# Patient Record
Sex: Female | Born: 1990 | Race: White | Hispanic: No | Marital: Single | Smoking: Former smoker
Health system: Southern US, Community
[De-identification: ages and names within clinical notes are randomized; demographics above are authoritative.]

## PROBLEM LIST (undated history)

## (undated) ENCOUNTER — Inpatient Hospital Stay (HOSPITAL_COMMUNITY): Payer: Self-pay

## (undated) DIAGNOSIS — N159 Renal tubulo-interstitial disease, unspecified: Secondary | ICD-10-CM

## (undated) DIAGNOSIS — IMO0002 Reserved for concepts with insufficient information to code with codable children: Secondary | ICD-10-CM

## (undated) DIAGNOSIS — F1920 Other psychoactive substance dependence, uncomplicated: Secondary | ICD-10-CM

## (undated) DIAGNOSIS — I739 Peripheral vascular disease, unspecified: Secondary | ICD-10-CM

## (undated) DIAGNOSIS — R87619 Unspecified abnormal cytological findings in specimens from cervix uteri: Secondary | ICD-10-CM

## (undated) HISTORY — DX: Unspecified abnormal cytological findings in specimens from cervix uteri: R87.619

## (undated) HISTORY — PX: APPENDECTOMY: SHX54

## (undated) HISTORY — DX: Other psychoactive substance dependence, uncomplicated: F19.20

## (undated) HISTORY — DX: Renal tubulo-interstitial disease, unspecified: N15.9

## (undated) HISTORY — DX: Reserved for concepts with insufficient information to code with codable children: IMO0002

---

## 2010-08-27 DIAGNOSIS — I739 Peripheral vascular disease, unspecified: Secondary | ICD-10-CM

## 2010-08-27 HISTORY — DX: Peripheral vascular disease, unspecified: I73.9

## 2010-08-27 HISTORY — PX: OTHER SURGICAL HISTORY: SHX169

## 2011-03-20 ENCOUNTER — Encounter: Payer: Self-pay | Admitting: Obstetrics & Gynecology

## 2013-03-25 ENCOUNTER — Ambulatory Visit: Payer: BC Managed Care – PPO | Admitting: *Deleted

## 2013-03-25 VITALS — BP 112/68 | Ht 62.0 in | Wt 135.0 lb

## 2013-03-25 DIAGNOSIS — Z3481 Encounter for supervision of other normal pregnancy, first trimester: Secondary | ICD-10-CM

## 2013-03-25 NOTE — Progress Notes (Signed)
p-71  Pt is here for her initial prenatal with the nurse.  She is G2P1 with a csection.  She does have a h/o pain med abuse.  Bedside u/s today showed CRL of 9w1 d and pos FHT.  PN labs were drawn today.  She will return 04/03/13 for a visit with the midwife.  She does want early genetic screening.

## 2013-03-26 LAB — OBSTETRIC PANEL
Antibody Screen: NEGATIVE
Basophils Absolute: 0 10*3/uL (ref 0.0–0.1)
Basophils Relative: 0 % (ref 0–1)
Eosinophils Absolute: 0.1 10*3/uL (ref 0.0–0.7)
Hemoglobin: 11.8 g/dL — ABNORMAL LOW (ref 12.0–15.0)
Hepatitis B Surface Ag: NEGATIVE
MCH: 29.4 pg (ref 26.0–34.0)
MCHC: 34.3 g/dL (ref 30.0–36.0)
Monocytes Absolute: 0.5 10*3/uL (ref 0.1–1.0)
Monocytes Relative: 10 % (ref 3–12)
Neutrophils Relative %: 62 % (ref 43–77)
RDW: 13.6 % (ref 11.5–15.5)
Rh Type: POSITIVE

## 2013-03-26 LAB — GC/CHLAMYDIA PROBE AMP, URINE
Chlamydia, Swab/Urine, PCR: NEGATIVE
GC Probe Amp, Urine: NEGATIVE

## 2013-04-03 ENCOUNTER — Ambulatory Visit (INDEPENDENT_AMBULATORY_CARE_PROVIDER_SITE_OTHER): Payer: BC Managed Care – PPO | Admitting: Family

## 2013-04-03 ENCOUNTER — Encounter (HOSPITAL_COMMUNITY): Payer: Self-pay | Admitting: Family

## 2013-04-03 ENCOUNTER — Encounter: Payer: Self-pay | Admitting: Family

## 2013-04-03 VITALS — BP 98/57 | Wt 135.0 lb

## 2013-04-03 DIAGNOSIS — Z98891 History of uterine scar from previous surgery: Secondary | ICD-10-CM

## 2013-04-03 DIAGNOSIS — F1910 Other psychoactive substance abuse, uncomplicated: Secondary | ICD-10-CM

## 2013-04-03 DIAGNOSIS — F17200 Nicotine dependence, unspecified, uncomplicated: Secondary | ICD-10-CM

## 2013-04-03 DIAGNOSIS — F1911 Other psychoactive substance abuse, in remission: Secondary | ICD-10-CM

## 2013-04-03 DIAGNOSIS — O09511 Supervision of elderly primigravida, first trimester: Secondary | ICD-10-CM

## 2013-04-03 DIAGNOSIS — O09519 Supervision of elderly primigravida, unspecified trimester: Secondary | ICD-10-CM

## 2013-04-03 DIAGNOSIS — O0990 Supervision of high risk pregnancy, unspecified, unspecified trimester: Secondary | ICD-10-CM

## 2013-04-03 DIAGNOSIS — O0991 Supervision of high risk pregnancy, unspecified, first trimester: Secondary | ICD-10-CM

## 2013-04-03 DIAGNOSIS — Z124 Encounter for screening for malignant neoplasm of cervix: Secondary | ICD-10-CM

## 2013-04-03 DIAGNOSIS — Z9889 Other specified postprocedural states: Secondary | ICD-10-CM

## 2013-04-03 NOTE — Progress Notes (Signed)
P - 84 Pt states she had some vaginal spotting yesterday

## 2013-04-03 NOTE — Progress Notes (Signed)
New provider OB visit; reviewed results of labs (all nml); Hx of csection > desires repeat.  Integrated screen scheduled.  Pap smear collected. Exam   BP 98/57  Wt 135 lb (61.236 kg)  BMI 24.69 kg/m2  LMP 01/14/2013 Uterine Size: size equals dates  Pelvic Exam:    Perineum: No Hemorrhoids, Normal Perineum   Vulva: normal   Vagina:  normal mucosa, normal discharge, no palpable nodules   pH: Not done   Cervix: no bleeding following Pap, no cervical motion tenderness and no lesions   Adnexa: normal adnexa and no mass, fullness, tenderness   Bony Pelvis: Adequate  System: Breast:  No nipple retraction or dimpling, No nipple discharge or bleeding, No axillary or supraclavicular adenopathy, Normal to palpation without dominant masses   Skin: normal coloration and turgor, no rashes    Neurologic: negative   Extremities: normal strength, tone, and muscle mass   HEENT neck supple with midline trachea and thyroid without masses   Mouth/Teeth mucous membranes moist, pharynx normal without lesions   Neck supple and no masses   Cardiovascular: regular rate and rhythm, no murmurs or gallops   Respiratory:  appears well, vitals normal, no respiratory distress, acyanotic, normal RR, neck free of mass or lymphadenopathy, chest clear, no wheezing, crepitations, rhonchi, normal symmetric air entry   Abdomen: soft, non-tender; bowel sounds normal; no masses,  no organomegaly   Urinary: urethral meatus normal

## 2013-04-20 ENCOUNTER — Encounter (HOSPITAL_COMMUNITY): Payer: Self-pay

## 2013-04-20 ENCOUNTER — Ambulatory Visit (HOSPITAL_COMMUNITY)
Admission: RE | Admit: 2013-04-20 | Discharge: 2013-04-20 | Disposition: A | Discharge status: Home / Self Care | Payer: BC Managed Care – PPO | Source: Ambulatory Visit | Attending: Obstetrics & Gynecology | Admitting: Obstetrics & Gynecology

## 2013-04-20 ENCOUNTER — Ambulatory Visit (HOSPITAL_COMMUNITY)
Admission: RE | Admit: 2013-04-20 | Discharge: 2013-04-20 | Disposition: A | Discharge status: Home / Self Care | Payer: BC Managed Care – PPO | Source: Ambulatory Visit | Attending: Family | Admitting: Family

## 2013-04-20 ENCOUNTER — Other Ambulatory Visit: Payer: Self-pay

## 2013-04-20 DIAGNOSIS — F1920 Other psychoactive substance dependence, uncomplicated: Secondary | ICD-10-CM | POA: Insufficient documentation

## 2013-04-20 DIAGNOSIS — O3510X0 Maternal care for (suspected) chromosomal abnormality in fetus, unspecified, not applicable or unspecified: Secondary | ICD-10-CM | POA: Insufficient documentation

## 2013-04-20 DIAGNOSIS — Z3689 Encounter for other specified antenatal screening: Secondary | ICD-10-CM | POA: Insufficient documentation

## 2013-04-20 DIAGNOSIS — O99330 Smoking (tobacco) complicating pregnancy, unspecified trimester: Secondary | ICD-10-CM | POA: Insufficient documentation

## 2013-04-20 DIAGNOSIS — F1120 Opioid dependence, uncomplicated: Secondary | ICD-10-CM | POA: Insufficient documentation

## 2013-04-20 DIAGNOSIS — O351XX0 Maternal care for (suspected) chromosomal abnormality in fetus, not applicable or unspecified: Secondary | ICD-10-CM | POA: Insufficient documentation

## 2013-04-20 DIAGNOSIS — O34219 Maternal care for unspecified type scar from previous cesarean delivery: Secondary | ICD-10-CM | POA: Insufficient documentation

## 2013-04-20 DIAGNOSIS — O09511 Supervision of elderly primigravida, first trimester: Secondary | ICD-10-CM

## 2013-04-20 NOTE — Progress Notes (Signed)
Sara Farrell  was seen today for an ultrasound appointment.  See full report in [facility].  Impression: Single IUP at 12 6/7 weeks Normal NT (2.2 mm) Nasal bone present First trimester aneuploidy screen performed as noted above.    Recommendations: Please do not draw triple/quad screen, though patient should be offered MSAFP for neural tube defect screening. Recommend follow up ultrasound at approximately 18 weeks for anatomy  Alpha Gula, MD

## 2013-04-22 ENCOUNTER — Telehealth: Payer: Self-pay | Admitting: *Deleted

## 2013-04-22 ENCOUNTER — Encounter: Payer: Self-pay | Admitting: Family

## 2013-04-22 ENCOUNTER — Encounter: Payer: Self-pay | Admitting: *Deleted

## 2013-04-22 DIAGNOSIS — Z98891 History of uterine scar from previous surgery: Secondary | ICD-10-CM

## 2013-04-22 DIAGNOSIS — F1911 Other psychoactive substance abuse, in remission: Secondary | ICD-10-CM

## 2013-04-22 DIAGNOSIS — O0991 Supervision of high risk pregnancy, unspecified, first trimester: Secondary | ICD-10-CM

## 2013-04-23 NOTE — Telephone Encounter (Signed)
See above

## 2013-05-04 ENCOUNTER — Ambulatory Visit (INDEPENDENT_AMBULATORY_CARE_PROVIDER_SITE_OTHER): Payer: BC Managed Care – PPO | Admitting: Family

## 2013-05-04 VITALS — BP 98/58 | Wt 129.0 lb

## 2013-05-04 DIAGNOSIS — O0990 Supervision of high risk pregnancy, unspecified, unspecified trimester: Secondary | ICD-10-CM

## 2013-05-04 DIAGNOSIS — O0991 Supervision of high risk pregnancy, unspecified, first trimester: Secondary | ICD-10-CM

## 2013-05-04 MED ORDER — ONDANSETRON 8 MG PO TBDP: 8.0000 mg | ORAL_TABLET | Freq: Three times a day (TID) | ORAL | Status: DC | PRN

## 2013-05-04 NOTE — Progress Notes (Signed)
P - 91 - Pt states still has n/v - dissolvable zofran helped more than the tablet zofran

## 2013-05-04 NOTE — Progress Notes (Signed)
Pt here on suboxone desiring to self detox; also would like nicotine replacement > refer to MFM for consultation regarding both nicotine replacement with concurrent suboxone use/possible detox.  Fundal height measuring 22 cm > growth ultrasound.

## 2013-05-06 ENCOUNTER — Encounter: Payer: Self-pay | Admitting: Obstetrics & Gynecology

## 2013-05-12 ENCOUNTER — Ambulatory Visit (HOSPITAL_COMMUNITY)
Admission: RE | Admit: 2013-05-12 | Discharge: 2013-05-12 | Disposition: A | Discharge status: Home / Self Care | Payer: BC Managed Care – PPO | Source: Ambulatory Visit | Attending: Family | Admitting: Family

## 2013-05-12 VITALS — BP 95/47 | HR 54 | Wt 131.0 lb

## 2013-05-12 DIAGNOSIS — F1120 Opioid dependence, uncomplicated: Secondary | ICD-10-CM

## 2013-05-12 DIAGNOSIS — O99332 Smoking (tobacco) complicating pregnancy, second trimester: Secondary | ICD-10-CM

## 2013-05-12 NOTE — Consult Note (Signed)
MFM consult  22 yr old G2P1001 at [redacted]w[redacted]d referred by Sid Falcon for consult secondary to narcotic dependence on suboxone and tobacco use.  Past OB hx: full term C section PMH: none PSH: C section M eds: suboxone, vitamins, zofran Allergies: none Social: smokes 5-10 cigarettes/day; is on suboxone due to use of pain pills prior is followed by a psychiatrist  I counseled the patient as follows: 1. Suboxone use: - in patients who have an addiction to narcotics or have chronic pain, suboxone maintenance therapy has been shown to decrease adverse events in pregnant women when compared to continuing non controlled doses of narcotics. A stable suboxone dose reduces stress on the fetus from repeated withdrawal due to inconsistent availability of illicit drugs. Additional benefits include a potential reduction in drug-seeking behaviors  - it is unclear if suboxone use is associated with teratogenicity; there have been some reports to suggest a slight increase in congenital anomalies with suboxone use  - there is an increased risk of preterm delivery, fetal growth restriction, and NICU admission with suboxone therapy in pregnancy  - there is a risk of neonatal abstinence syndrome. I explained the neonate will be closely monitored for signs/symptoms of withdrawal and will be treated as necessary. The neonates are often treated with opiates and slowly weaned- a process that can take days to weeks  - the recommendations are to continue a maintenance dose of suboxone in pregnancy as stopping suboxone in pregnancy has a higher risk of withdrawal, fetal death, and risk of use of illicit drugs  - suboxone should be continued after delivery. Additional pain medication should be given as indicated. Suboxone should not be used to treat pain related to delivery. Nubain should be avoided given it is a partial opiate antagonist.  - I recommend obtaining a Neonatology consult to discuss neonatal abstinence syndrome  with the patient 2. I recommend serial ultrasounds for fetal growth every 4 weeks. Antenatal testing should be started only in the setting of fetal growth restriction.  3. I recommend a fetal anatomic survey at 18-20 weeks.  4. Patient had a nromal first trimester screen - recommend maternal serum AFP at 15-[redacted] weeks gestation 5. Tobacco use: - discussed risks of tobacco use in pregnancy: fetal growth restriction, placental abruption, preterm delivery, and after delivery risk of SIDS and childhood asthma - patient is interested in stopping; has failed trying on her own - discussed option of nicotine patch- discussed may be used in pregnancy; need to avoid smoking while on the nicotine patch - discussed there are adverse effects of nicotine on the fetus; however no strong evidence that use of nicotine replacement therapy increases risk of adverse outcomes compared to smokers - also discussed option of buproprion: discussed may be linked to increase risk of birth defects if taken in the first trimester; however after the first trimester no known associated risks although may have a component of neonatal withdrawal - discussed there is little data on chantix in pregnancy so other modalities are preferred  I spent 30 minutes in face to face consultation with the patient.  Eulis Foster, MD

## 2013-05-13 ENCOUNTER — Encounter: Payer: Self-pay | Admitting: Family

## 2013-05-18 ENCOUNTER — Encounter: Payer: BC Managed Care – PPO | Admitting: Obstetrics and Gynecology

## 2013-05-25 ENCOUNTER — Other Ambulatory Visit: Payer: Self-pay | Admitting: Family

## 2013-05-25 DIAGNOSIS — O0991 Supervision of high risk pregnancy, unspecified, first trimester: Secondary | ICD-10-CM

## 2013-05-26 ENCOUNTER — Ambulatory Visit (HOSPITAL_COMMUNITY)
Admission: RE | Admit: 2013-05-26 | Discharge: 2013-05-26 | Disposition: A | Discharge status: Home / Self Care | Payer: BC Managed Care – PPO | Source: Ambulatory Visit | Attending: Obstetrics and Gynecology | Admitting: Obstetrics and Gynecology

## 2013-05-26 VITALS — BP 101/50 | HR 66 | Wt 133.0 lb

## 2013-05-26 DIAGNOSIS — F1911 Other psychoactive substance abuse, in remission: Secondary | ICD-10-CM

## 2013-05-26 DIAGNOSIS — O0991 Supervision of high risk pregnancy, unspecified, first trimester: Secondary | ICD-10-CM

## 2013-05-26 DIAGNOSIS — Z98891 History of uterine scar from previous surgery: Secondary | ICD-10-CM

## 2013-05-26 DIAGNOSIS — Z363 Encounter for antenatal screening for malformations: Secondary | ICD-10-CM | POA: Insufficient documentation

## 2013-05-26 DIAGNOSIS — F1120 Opioid dependence, uncomplicated: Secondary | ICD-10-CM | POA: Insufficient documentation

## 2013-05-26 DIAGNOSIS — F1920 Other psychoactive substance dependence, uncomplicated: Secondary | ICD-10-CM | POA: Insufficient documentation

## 2013-05-26 DIAGNOSIS — Z1389 Encounter for screening for other disorder: Secondary | ICD-10-CM | POA: Insufficient documentation

## 2013-05-26 DIAGNOSIS — O358XX0 Maternal care for other (suspected) fetal abnormality and damage, not applicable or unspecified: Secondary | ICD-10-CM | POA: Insufficient documentation

## 2013-05-26 NOTE — ED Notes (Signed)
Patient states she has not felt movement in over 2 weeks.

## 2013-05-27 ENCOUNTER — Encounter: Payer: Self-pay | Admitting: Family

## 2013-05-31 ENCOUNTER — Encounter (HOSPITAL_COMMUNITY): Payer: Self-pay | Admitting: *Deleted

## 2013-05-31 ENCOUNTER — Inpatient Hospital Stay (HOSPITAL_COMMUNITY)
Admission: AD | Admit: 2013-05-31 | Discharge: 2013-06-01 | Disposition: A | Discharge status: Home / Self Care | Payer: BC Managed Care – PPO | Source: Ambulatory Visit | Attending: Obstetrics & Gynecology | Admitting: Obstetrics & Gynecology

## 2013-05-31 DIAGNOSIS — Y92410 Unspecified street and highway as the place of occurrence of the external cause: Secondary | ICD-10-CM | POA: Insufficient documentation

## 2013-05-31 DIAGNOSIS — O9A212 Injury, poisoning and certain other consequences of external causes complicating pregnancy, second trimester: Secondary | ICD-10-CM

## 2013-05-31 DIAGNOSIS — M7918 Myalgia, other site: Secondary | ICD-10-CM

## 2013-05-31 DIAGNOSIS — IMO0001 Reserved for inherently not codable concepts without codable children: Secondary | ICD-10-CM | POA: Insufficient documentation

## 2013-05-31 DIAGNOSIS — M549 Dorsalgia, unspecified: Secondary | ICD-10-CM | POA: Insufficient documentation

## 2013-05-31 DIAGNOSIS — O99891 Other specified diseases and conditions complicating pregnancy: Secondary | ICD-10-CM | POA: Insufficient documentation

## 2013-05-31 DIAGNOSIS — R109 Unspecified abdominal pain: Secondary | ICD-10-CM | POA: Insufficient documentation

## 2013-05-31 MED ORDER — CYCLOBENZAPRINE HCL 10 MG PO TABS
10.0000 mg | ORAL_TABLET | ORAL | Status: AC
  Administered 2013-06-01: 10 mg via ORAL
  Filled 2013-05-31: qty 1

## 2013-05-31 NOTE — MAU Note (Signed)
Pt arrived per EMS. Pt rates 8/10 R lower back pain and L side pain. Pt reports that it hurts worse when she breathes in.
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
## 2013-06-01 DIAGNOSIS — IMO0001 Reserved for inherently not codable concepts without codable children: Secondary | ICD-10-CM

## 2013-06-01 DIAGNOSIS — M549 Dorsalgia, unspecified: Secondary | ICD-10-CM

## 2013-06-01 DIAGNOSIS — R109 Unspecified abdominal pain: Secondary | ICD-10-CM

## 2013-06-01 MED ORDER — OXYCODONE-ACETAMINOPHEN 5-325 MG PO TABS: 2.0000 | ORAL_TABLET | Freq: Four times a day (QID) | ORAL | Status: DC | PRN

## 2013-06-01 MED ORDER — OXYCODONE-ACETAMINOPHEN 5-325 MG PO TABS
2.0000 | ORAL_TABLET | ORAL | Status: AC
  Administered 2013-06-01: 2 via ORAL
  Filled 2013-06-01: qty 2

## 2013-06-01 MED ORDER — IBUPROFEN 600 MG PO TABS: 600.0000 mg | ORAL_TABLET | Freq: Four times a day (QID) | ORAL | Status: DC | PRN

## 2013-06-01 NOTE — MAU Provider Note (Signed)
Attestation of Attending Supervision of Advanced Practitioner (CNM/NP): Evaluation and management procedures were performed by the Advanced Practitioner under my supervision and collaboration. I have reviewed the Advanced Practitioner's note and chart, and I agree with the management and plan.  LEGGETT,KELLY H. 7:08 AM

## 2013-06-05 ENCOUNTER — Encounter: Payer: BC Managed Care – PPO | Admitting: Family

## 2013-06-08 ENCOUNTER — Encounter: Payer: BC Managed Care – PPO | Admitting: Advanced Practice Midwife

## 2013-07-20 ENCOUNTER — Other Ambulatory Visit: Payer: Self-pay | Admitting: Family

## 2013-07-20 DIAGNOSIS — O0991 Supervision of high risk pregnancy, unspecified, first trimester: Secondary | ICD-10-CM

## 2013-07-21 ENCOUNTER — Ambulatory Visit (HOSPITAL_COMMUNITY): Payer: No Typology Code available for payment source | Attending: Obstetrics and Gynecology

## 2013-08-04 ENCOUNTER — Ambulatory Visit (INDEPENDENT_AMBULATORY_CARE_PROVIDER_SITE_OTHER): Payer: BC Managed Care – PPO | Admitting: Obstetrics & Gynecology

## 2013-08-04 VITALS — BP 113/59 | Wt 146.0 lb

## 2013-08-04 DIAGNOSIS — F17200 Nicotine dependence, unspecified, uncomplicated: Secondary | ICD-10-CM

## 2013-08-04 DIAGNOSIS — O0993 Supervision of high risk pregnancy, unspecified, third trimester: Secondary | ICD-10-CM

## 2013-08-04 DIAGNOSIS — O0990 Supervision of high risk pregnancy, unspecified, unspecified trimester: Secondary | ICD-10-CM

## 2013-08-04 DIAGNOSIS — Z86718 Personal history of other venous thrombosis and embolism: Secondary | ICD-10-CM | POA: Insufficient documentation

## 2013-08-04 NOTE — Progress Notes (Deleted)
Pt returns to prenatal care after 2 months.  Need [place] for growth per MFM.  Also pt has ? History of blood clot in her right arm.

## 2013-08-04 NOTE — Progress Notes (Signed)
P - 101 - Pt states she had experienced a lot of "back cramps and lower abd cramps along with bilateral hip pain" - Pt has never had anxiety attacks but recently has had them for about 1.5 months ago.

## 2013-08-04 NOTE — Progress Notes (Signed)
Pt returns to prenatal care after 2 months. Need [place] for growth per MFM. Also pt has ? History of blood clot in her right arm 2 1/2 yrs ago.  Pt was on blood thinners (? Coumadin).  Need records.  If s, pt needs anticoagulation.  Pt down to 1/8 tab Subuxone daily without symptoms of withdrawal.   Pt advised not to ween during pregnancy due to risk of preterm labor.  Pt will contact us with signs of withdrawal.   Needs GCT adn 28 week labs.  Need [place] for growth per MFM

## 2013-08-04 NOTE — Progress Notes (Deleted)
Pt has history of blood clot in right arm 2 1/2 years ago.

## 2013-08-06 ENCOUNTER — Other Ambulatory Visit: Payer: BC Managed Care – PPO

## 2013-08-11 ENCOUNTER — Ambulatory Visit (HOSPITAL_COMMUNITY)
Admission: RE | Admit: 2013-08-11 | Discharge: 2013-08-11 | Disposition: A | Discharge status: Home / Self Care | Payer: BC Managed Care – PPO | Source: Ambulatory Visit | Attending: Obstetrics and Gynecology | Admitting: Obstetrics and Gynecology

## 2013-08-11 ENCOUNTER — Encounter: Payer: Self-pay | Admitting: Obstetrics & Gynecology

## 2013-08-11 VITALS — BP 120/64 | HR 68 | Wt 146.0 lb

## 2013-08-11 DIAGNOSIS — O99330 Smoking (tobacco) complicating pregnancy, unspecified trimester: Secondary | ICD-10-CM | POA: Insufficient documentation

## 2013-08-11 DIAGNOSIS — F1920 Other psychoactive substance dependence, uncomplicated: Secondary | ICD-10-CM | POA: Insufficient documentation

## 2013-08-11 DIAGNOSIS — O34219 Maternal care for unspecified type scar from previous cesarean delivery: Secondary | ICD-10-CM | POA: Insufficient documentation

## 2013-08-11 DIAGNOSIS — F1120 Opioid dependence, uncomplicated: Secondary | ICD-10-CM | POA: Insufficient documentation

## 2013-08-11 DIAGNOSIS — O0991 Supervision of high risk pregnancy, unspecified, first trimester: Secondary | ICD-10-CM

## 2013-08-11 DIAGNOSIS — Z98891 History of uterine scar from previous surgery: Secondary | ICD-10-CM

## 2013-08-11 DIAGNOSIS — F1911 Other psychoactive substance abuse, in remission: Secondary | ICD-10-CM

## 2013-08-11 DIAGNOSIS — O0993 Supervision of high risk pregnancy, unspecified, third trimester: Secondary | ICD-10-CM

## 2013-08-11 NOTE — Progress Notes (Signed)
Sara Farrell  was seen today for an ultrasound appointment.  See full report in [facility].  Impression: Single IUP at 29 0/7 weeks Hx of Suboxone use, Tobacco use Interval growth is appropriate (48th %tile) Normal amniotic fluid volume  Recommendations: Follow-up ultrasounds as clinically indicated.   Alpha Gula, MD

## 2013-08-18 ENCOUNTER — Encounter: Payer: BC Managed Care – PPO | Admitting: Family Medicine

## 2013-10-08 ENCOUNTER — Ambulatory Visit (INDEPENDENT_AMBULATORY_CARE_PROVIDER_SITE_OTHER): Payer: BC Managed Care – PPO | Admitting: Obstetrics & Gynecology

## 2013-10-08 ENCOUNTER — Encounter: Payer: Self-pay | Admitting: Obstetrics & Gynecology

## 2013-10-08 VITALS — BP 111/65 | Wt 149.0 lb

## 2013-10-08 DIAGNOSIS — Z3480 Encounter for supervision of other normal pregnancy, unspecified trimester: Secondary | ICD-10-CM

## 2013-10-08 NOTE — Progress Notes (Signed)
p-80  Pt has not had her 28 week labs.  She states that her insurance was cancelled in January and that is why she hasn't been here.  Wants to discuss having scheduled c-section

## 2013-10-08 NOTE — Progress Notes (Signed)
Pt has not been to appts for 10 weeks.  Never got [facility] about blood clot in the arm.  Will re request records. Cultures and UDS today.  Last growth at MFM nml. C/S and BTL scheduled for 39 weeks (Feb 24th).

## 2013-10-09 LAB — GC/CHLAMYDIA PROBE AMP
CT Probe RNA: NEGATIVE
GC Probe RNA: NEGATIVE

## 2013-10-10 LAB — CULTURE, BETA STREP (GROUP B ONLY)

## 2013-10-12 LAB — OPIATES/OPIOIDS (LC/MS-MS)
CODEINE URINE: NEGATIVE ng/mL
HYDROCODONE: NEGATIVE ng/mL
HYDROMORPHONE: NEGATIVE ng/mL
Heroin (6-AM), UR: NEGATIVE ng/mL
MORPHINE: NEGATIVE ng/mL
NORHYDROCODONE, UR: NEGATIVE ng/mL
Noroxycodone, Ur: NEGATIVE ng/mL
Oxycodone, ur: NEGATIVE ng/mL
Oxymorphone: NEGATIVE ng/mL

## 2013-10-12 LAB — PRESCRIPTION MONITORING PROFILE (19 PANEL)
AMPHETAMINE/METH: NEGATIVE ng/mL
BARBITURATE SCREEN, URINE: NEGATIVE ng/mL
BENZODIAZEPINE SCREEN, URINE: NEGATIVE ng/mL
COCAINE METABOLITES: NEGATIVE ng/mL
CREATININE, URINE: 103.86 mg/dL (ref >20.0)
Carisoprodol, Urine: NEGATIVE ng/mL
FENTANYL URINE: NEGATIVE ng/mL
MDMA URINE: NEGATIVE ng/mL
Meperidine, Ur: NEGATIVE ng/mL
Methadone Screen, Urine: NEGATIVE ng/mL
Methaqualone: NEGATIVE ng/mL
Nitrites, Initial: NEGATIVE ug/mL
OXYCODONE SCRN UR: NEGATIVE ng/mL
PH URINE, INITIAL: 7.1 pH (ref 4.5–8.9)
Phencyclidine, Ur: NEGATIVE ng/mL
Propoxyphene: NEGATIVE ng/mL
TAPENTADOLUR: NEGATIVE ng/mL
TRAMADOL UR: NEGATIVE ng/mL
ZOLPIDEM, URINE: NEGATIVE ng/mL

## 2013-10-12 LAB — BUPRENORPHINES (GC/LC/MS), URINE
Buprenorphine: >1000 ng/mL — AB
NORBUPRENORPHINE: 885 ng/mL — AB

## 2013-10-12 LAB — CANNABANOIDS (GC/LC/MS), URINE: THC-COOH UR CONFIRM: 298 ng/mL — AB

## 2013-10-13 ENCOUNTER — Other Ambulatory Visit: Payer: Self-pay | Admitting: Obstetrics & Gynecology

## 2013-10-13 ENCOUNTER — Encounter: Payer: Self-pay | Admitting: Obstetrics & Gynecology

## 2013-10-14 ENCOUNTER — Encounter (HOSPITAL_COMMUNITY): Payer: Self-pay | Admitting: Pharmacist

## 2013-10-16 ENCOUNTER — Encounter: Payer: Self-pay | Admitting: Advanced Practice Midwife

## 2013-10-16 ENCOUNTER — Ambulatory Visit (INDEPENDENT_AMBULATORY_CARE_PROVIDER_SITE_OTHER): Payer: BC Managed Care – PPO | Admitting: Advanced Practice Midwife

## 2013-10-16 VITALS — BP 82/59 | Wt 155.0 lb

## 2013-10-16 DIAGNOSIS — Z3480 Encounter for supervision of other normal pregnancy, unspecified trimester: Secondary | ICD-10-CM

## 2013-10-16 DIAGNOSIS — O0930 Supervision of pregnancy with insufficient antenatal care, unspecified trimester: Secondary | ICD-10-CM

## 2013-10-16 NOTE — Progress Notes (Signed)
p-72 attempt to drink 50 gm of Glucola and stopped due to N and V

## 2013-10-16 NOTE — H&P (Signed)
  Sara Farrell is an 23 y.o. G2P1001 2451w3d female.   Chief Complaint: IUP at 39 wks, previous C-S, desires repeat HPI: Previous C-section for fetal distress, elects repeat with sterilization.  Past Medical History  Diagnosis Date  . Addiction to drug     Percocet  . Abnormal Pap smear     HPV  . Chronic kidney infection     Increased freq of kidney infx    Past Surgical History  Procedure Laterality Date  . Appendectomy      age 23  . Cesarean section      Family History  Problem Relation Age of Onset  . Cancer Maternal Grandmother     renal  . Emphysema Maternal Grandfather   . Prune belly syndrome Maternal Uncle     Renal problems   Social History:  reports that she has been smoking Cigarettes.  She has a 2 pack-year smoking history. She has never used smokeless tobacco. She reports that she uses illicit drugs. She reports that she does not drink alcohol.  Allergies: No Known Allergies  No prescriptions prior to admission     A comprehensive review of systems was negative.  Last menstrual period 01/14/2013. LMP 01/14/2013 General appearance: alert, cooperative and appears stated age Head: Normocephalic, without obvious abnormality, atraumatic Neck: supple, symmetrical, trachea midline Lungs: clear to auscultation bilaterally Heart: regular rate and rhythm Abdomen: normal findings: soft, non-tender and gravid Extremities: extremities normal, atraumatic, no cyanosis or edema Skin: Skin color, texture, turgor normal. No rashes or lesions Neurologic: Grossly normal   Prenatal Transfer Tool  Maternal Diabetes: No Unknown--did not take 1 hour Genetic Screening: Normal first screen Maternal Ultrasounds/Referrals: Normal Fetal Ultrasounds or other Referrals:  None Maternal Substance Abuse:  Yes:  Type: Marijuana, Other: suboxone Significant Maternal Medications:  None see above Significant Maternal Lab Results: Lab values include: Group B Strep  positive     Lab Results  Component Value Date   WBC 5.1 03/25/2013   HGB 11.8* 03/25/2013   HCT 34.4* 03/25/2013   MCV 85.8 03/25/2013   PLT 174 03/25/2013   No results found for this basename: PREGTESTUR, PREGSERUM, HCG, HCGQUANT     Assessment/Plan Patient Active Problem List   Diagnosis Date Noted  . Possible history of blood clot in right arm. 08/04/2013  . Supervision of high risk pregnancy in first trimester 04/03/2013  . Hx of substance abuse 04/03/2013  . Active smoker 04/03/2013  . H/O: C-section 04/03/2013  Desires sterility Repeat C-section and BTL  Sara Farrell S 10/16/2013, 12:46 PM

## 2013-10-18 DIAGNOSIS — O0930 Supervision of pregnancy with insufficient antenatal care, unspecified trimester: Secondary | ICD-10-CM | POA: Insufficient documentation

## 2013-10-18 NOTE — Progress Notes (Signed)
Reviewed signs of labor. Unable to complete glucola, vomited fluid. Unable to do random FS due to drinking glucola. Has C/S scheduled in 4 days.  UDS + for MJ and opiates

## 2013-10-18 NOTE — Patient Instructions (Signed)
Cesarean Delivery  Cesarean delivery is the birth of a baby through a cut (incision) in the abdomen and womb (uterus).  LET YOUR HEALTH CARE PROVIDER KNOW ABOUT:  All medicines you are taking, including vitamins, herbs, eye drops, creams, and over-the-counter medicines.  Previous problems you or members of your family have had with the use of anesthetics.  Any blood disorders you have.  Previous surgeries you have had.  Medical conditions you have.  Any allergies you have.  Complicationsinvolving the pregnancy. RISKS AND COMPLICATIONS  Generally, this is a safe procedure. However, as with any procedure, complications can occur. Possible complications include:  Bleeding.  Infection.  Blood clots.  Injury to surrounding organs.  Problems with anesthesia.  Injury to the baby. BEFORE THE PROCEDURE   You may be given an antacid medicine to drink. This will prevent acid contents in your stomach from going into your lungs if you vomit during the surgery.  You may be given an antibiotic medicine to prevent infection. PROCEDURE   Hair may be removed from your pubic area and your lower abdomen. This is to prevent infection in the incision site.  A tube (Foley catheter) will be placed in your bladder to drain your urine from your bladder into a bag. This keeps your bladder empty during surgery.  An IV tube will be placed in your vein.  You may be given medicine to numb the lower half of your body (regional anesthetic). If you were in labor, you may have already had an epidural in place which can be used in both labor and cesarean delivery. You may possibly be given medicine to make you sleep (general anesthetic) though this is not as common.  An incision will be made in your abdomen that extends to your uterus. There are 2 basic kinds of incisions:  The horizontal (transverse) incision. Horizontal incisions are from side to side and are used for most routine cesarean  deliveries.  The vertical incision. The vertical incision is from the top of the abdomen to the bottom and is less commonly used. It is often done for women who have a serious complication (extreme prematurity) or under emergency situations.  The horizontal and vertical incisions may both be used at the same time. However, this is very uncommon.  An incision is then made in your uterus to deliver the baby.  Your baby will then be delivered.  Both incisions are then closed with absorbable stitches. AFTER THE PROCEDURE   If you were awake during the surgery, you will see your baby right away. If you were asleep, you will see your baby as soon as you are awake.  You may breastfeed your baby after surgery.  You may be able to get up and walk the same day as the surgery. If you need to stay in bed for a period of time, you will receive help to turn, cough, and take deep breaths after surgery. This helps prevent lung problems such as pneumonia.  Do not get out of bed alone the first time after surgery. You will need help getting out of bed until you are able to do this by yourself.  You may be able to shower the day after your cesarean delivery. After the bandage (dressing) is taken off the incision site, a nurse will assist you to shower if you would like help.  You will have pneumatic compression hose placed on your lower legs. This is done to prevent blood clots. When you are up   and walking regularly, they will no longer be necessary.  Do not cross your legs when you sit.  Save any blood clots that you pass. If you pass a clot while on the toilet, do not flush it. Call for the nurse. Tell the nurse if you think you are bleeding too much or passing too many clots.  You will be given medicine as needed. Let your health care providers know if you are hurting. You may also be given an antibiotic to prevent an infection.  Your IV tube will be taken out when you are drinking a reasonable  amount of fluids. The Foley catheter is taken out when you are up and walking.  If your blood type is Rh negative and your baby's blood type is Rh positive, you will be given a shot of anti-D immune globulin. This shot prevents you from having Rh problems with a future pregnancy. You should get the shot even if you had your tubes tied (tubal ligation).  If you are allowed to take the baby for a walk, place the baby in the bassinet and push it. Do not carry your baby in your arms. Document Released: 08/13/2005 Document Revised: 06/03/2013 Document Reviewed: 03/04/2013 Exit[employer] Patient Information 2014 [employer].  

## 2013-10-19 ENCOUNTER — Encounter (HOSPITAL_COMMUNITY)
Admission: RE | Admit: 2013-10-19 | Discharge: 2013-10-19 | Disposition: A | Discharge status: Home / Self Care | Payer: BC Managed Care – PPO | Source: Ambulatory Visit | Attending: Family Medicine | Admitting: Family Medicine

## 2013-10-19 ENCOUNTER — Inpatient Hospital Stay (HOSPITAL_COMMUNITY)
Admission: RE | Admit: 2013-10-19 | Discharge: 2013-10-22 | DRG: 766 | Disposition: A | Discharge status: Home / Self Care | Payer: BC Managed Care – PPO | Source: Ambulatory Visit | Attending: Family Medicine | Admitting: Family Medicine

## 2013-10-19 ENCOUNTER — Encounter (HOSPITAL_COMMUNITY): Payer: Self-pay

## 2013-10-19 ENCOUNTER — Encounter (HOSPITAL_COMMUNITY): Payer: Self-pay | Admitting: *Deleted

## 2013-10-19 ENCOUNTER — Ambulatory Visit (HOSPITAL_COMMUNITY)
Admission: RE | Admit: 2013-10-19 | Discharge: 2013-10-19 | Disposition: A | Discharge status: Home / Self Care | Payer: BC Managed Care – PPO | Source: Ambulatory Visit | Attending: Anesthesiology | Admitting: Anesthesiology

## 2013-10-19 DIAGNOSIS — O0991 Supervision of high risk pregnancy, unspecified, first trimester: Secondary | ICD-10-CM

## 2013-10-19 DIAGNOSIS — F1911 Other psychoactive substance abuse, in remission: Secondary | ICD-10-CM

## 2013-10-19 DIAGNOSIS — O99892 Other specified diseases and conditions complicating childbirth: Secondary | ICD-10-CM | POA: Diagnosis present

## 2013-10-19 DIAGNOSIS — Z98891 History of uterine scar from previous surgery: Secondary | ICD-10-CM

## 2013-10-19 DIAGNOSIS — O0930 Supervision of pregnancy with insufficient antenatal care, unspecified trimester: Secondary | ICD-10-CM

## 2013-10-19 DIAGNOSIS — Z9889 Other specified postprocedural states: Secondary | ICD-10-CM

## 2013-10-19 DIAGNOSIS — O34219 Maternal care for unspecified type scar from previous cesarean delivery: Principal | ICD-10-CM | POA: Diagnosis present

## 2013-10-19 DIAGNOSIS — Z302 Encounter for sterilization: Secondary | ICD-10-CM

## 2013-10-19 DIAGNOSIS — Z22330 Carrier of Group B streptococcus: Secondary | ICD-10-CM

## 2013-10-19 DIAGNOSIS — O9989 Other specified diseases and conditions complicating pregnancy, childbirth and the puerperium: Secondary | ICD-10-CM

## 2013-10-19 DIAGNOSIS — O99334 Smoking (tobacco) complicating childbirth: Secondary | ICD-10-CM | POA: Diagnosis present

## 2013-10-19 HISTORY — DX: Peripheral vascular disease, unspecified: I73.9

## 2013-10-19 MED ORDER — HEPARIN SOD (PORK) LOCK FLUSH 100 UNIT/ML IV SOLN
INTRAVENOUS | Status: AC
  Filled 2013-10-19: qty 5

## 2013-10-19 MED ORDER — LIDOCAINE HCL 1 % IJ SOLN
INTRAMUSCULAR | Status: AC
  Filled 2013-10-19: qty 20

## 2013-10-19 NOTE — Pre-Procedure Instructions (Signed)
Dr Cristela BlueKyle Jackson assess patient for IV.  Verbal order for PICC line insertion for surgery.  Verified verbal order.  Order placed in EPIC and [facility]. [facility] today after PAT appt.

## 2013-10-19 NOTE — Pre-Procedure Instructions (Signed)
[facility] if [facility] from PICC line around 12:15-12:30PM.  Sara Farrell stated to she or a nurse from [facility] to surgery.  Short Stay RN to call Sara Farrell at ext [phone].

## 2013-10-19 NOTE — Patient Instructions (Signed)
   Your procedure is scheduled on: Tuesday, Feb 24  [facility] at:  1215 PM - arrive earlier for labs to be drawn out of PICC Pick up the phone at the desk and dial [phone] and inform us of your arrival.  Please call this number if you have any problems the morning of surgery: [phone]  Remember: Do not eat after midnight: Monday Do not drink clear liquads after 10 AM Tuesday, day of surgery Take these medicines the morning of surgery with a SIP OF WATER:  None   Do not wear jewelry, make-up, or FINGER nail polish No metal in your hair or on your body. Do not wear lotions, powders, perfumes.You may wear deodorant.  Do not bring valuables to the hospital. Contacts, dentures or bridgework may not be worn into surgery.  Leave suitcase in the car. After Surgery it may be brought to your room. For patients being admitted to the hospital, checkout time is 11:00am the day of discharge.  Home with fiance Sara Farrell/ mother Sara Farrell cell [phone].

## 2013-10-19 NOTE — Procedures (Signed)
Placement of right arm PICC.  Tip in SVC and ready to use. Length = 34 cm.

## 2013-10-19 NOTE — Patient Instructions (Addendum)
   Your procedure is scheduled on:  Tuesday, Feb 24  [facility] at: 115 PM Pick up the phone at the desk and dial [phone] and inform us of your arrival.  Please call this number if you have any problems the morning of surgery: [phone]  Remember: Do not eat food after midnight: Monday Do not drink clear liquids after: 10 AM Tuesday, day of surgery Take these medicines the morning of surgery with a SIP OF WATER:  None  Do not wear jewelry, make-up, or FINGER nail polish No metal in your hair or on your body. Do not wear lotions, powders, perfumes.  You may wear deodorant.  Do not bring valuables to the hospital. Contacts, dentures or bridgework may not be worn into surgery.  Leave suitcase in the car. After Surgery it may be brought to your room. For patients being admitted to the hospital, checkout time is 11:00am the day of discharge.  Home with fiance [name]/ mother Suzette BattiestVeronica cell [phone].

## 2013-10-20 ENCOUNTER — Encounter (HOSPITAL_COMMUNITY): Payer: Self-pay | Admitting: *Deleted

## 2013-10-20 ENCOUNTER — Inpatient Hospital Stay (HOSPITAL_COMMUNITY): Payer: BC Managed Care – PPO | Admitting: Anesthesiology

## 2013-10-20 ENCOUNTER — Encounter (HOSPITAL_COMMUNITY)
Admission: RE | Disposition: A | Discharge status: Home / Self Care | Payer: Self-pay | Source: Ambulatory Visit | Attending: Family Medicine

## 2013-10-20 ENCOUNTER — Encounter (HOSPITAL_COMMUNITY): Payer: BC Managed Care – PPO | Admitting: Anesthesiology

## 2013-10-20 DIAGNOSIS — Z302 Encounter for sterilization: Secondary | ICD-10-CM

## 2013-10-20 DIAGNOSIS — O99892 Other specified diseases and conditions complicating childbirth: Secondary | ICD-10-CM

## 2013-10-20 DIAGNOSIS — O99334 Smoking (tobacco) complicating childbirth: Secondary | ICD-10-CM

## 2013-10-20 DIAGNOSIS — O9989 Other specified diseases and conditions complicating pregnancy, childbirth and the puerperium: Secondary | ICD-10-CM

## 2013-10-20 DIAGNOSIS — O34219 Maternal care for unspecified type scar from previous cesarean delivery: Secondary | ICD-10-CM

## 2013-10-20 DIAGNOSIS — Z9889 Other specified postprocedural states: Secondary | ICD-10-CM

## 2013-10-20 LAB — CBC
HEMATOCRIT: 35 % — AB (ref 36.0–46.0)
Hemoglobin: 11.8 g/dL — ABNORMAL LOW (ref 12.0–15.0)
MCH: 29.6 pg (ref 26.0–34.0)
MCHC: 33.7 g/dL (ref 30.0–36.0)
MCV: 87.9 fL (ref 78.0–100.0)
Platelets: 144 10*3/uL — ABNORMAL LOW (ref 150–400)
RBC: 3.98 MIL/uL (ref 3.87–5.11)
RDW: 12.7 % (ref 11.5–15.5)
WBC: 11.1 10*3/uL — AB (ref 4.0–10.5)

## 2013-10-20 LAB — TYPE AND SCREEN
ABO/RH(D): O POS
Antibody Screen: NEGATIVE

## 2013-10-20 LAB — RAPID URINE DRUG SCREEN, HOSP PERFORMED
AMPHETAMINES: NOT DETECTED
BARBITURATES: NOT DETECTED
Benzodiazepines: NOT DETECTED
Cocaine: NOT DETECTED
Opiates: POSITIVE — AB
Tetrahydrocannabinol: POSITIVE — AB

## 2013-10-20 LAB — ABO/RH: ABO/RH(D): O POS

## 2013-10-20 LAB — RPR: RPR: NONREACTIVE

## 2013-10-20 SURGERY — Surgical Case
Anesthesia: Spinal | Site: Abdomen | Laterality: Bilateral

## 2013-10-20 MED ORDER — LACTATED RINGERS IV BOLUS (SEPSIS)
500.0000 mL | Freq: Once | INTRAVENOUS | Status: AC
  Administered 2013-10-21: 500 mL via INTRAVENOUS

## 2013-10-20 MED ORDER — NALBUPHINE HCL 10 MG/ML IJ SOLN: 5.0000 mg | INTRAMUSCULAR | Status: DC | PRN

## 2013-10-20 MED ORDER — CEFAZOLIN SODIUM-DEXTROSE 2-3 GM-% IV SOLR
2.0000 g | INTRAVENOUS | Status: AC
  Administered 2013-10-20: 2 g via INTRAVENOUS

## 2013-10-20 MED ORDER — BUPIVACAINE HCL (PF) 0.25 % IJ SOLN
INTRAMUSCULAR | Status: AC
  Filled 2013-10-20: qty 30

## 2013-10-20 MED ORDER — FENTANYL CITRATE 0.05 MG/ML IJ SOLN
INTRAMUSCULAR | Status: AC
  Filled 2013-10-20: qty 2

## 2013-10-20 MED ORDER — ONDANSETRON HCL 4 MG/2ML IJ SOLN
INTRAMUSCULAR | Status: AC
  Filled 2013-10-20: qty 2

## 2013-10-20 MED ORDER — SODIUM CHLORIDE 0.9 % IJ SOLN: 3.0000 mL | INTRAMUSCULAR | Status: DC | PRN

## 2013-10-20 MED ORDER — DIPHENHYDRAMINE HCL 25 MG PO CAPS: 25.0000 mg | ORAL_CAPSULE | Freq: Four times a day (QID) | ORAL | Status: DC | PRN

## 2013-10-20 MED ORDER — SIMETHICONE 80 MG PO CHEW
80.0000 mg | CHEWABLE_TABLET | ORAL | Status: DC
  Administered 2013-10-21: 80 mg via ORAL
  Filled 2013-10-20 (×2): qty 1

## 2013-10-20 MED ORDER — METOCLOPRAMIDE HCL 5 MG/ML IJ SOLN: 10.0000 mg | Freq: Three times a day (TID) | INTRAMUSCULAR | Status: DC | PRN

## 2013-10-20 MED ORDER — HYDROMORPHONE HCL PF 1 MG/ML IJ SOLN
INTRAMUSCULAR | Status: AC
  Administered 2013-10-20: 0.5 mg via INTRAVENOUS
  Filled 2013-10-20: qty 1

## 2013-10-20 MED ORDER — DIPHENHYDRAMINE HCL 25 MG PO CAPS: 25.0000 mg | ORAL_CAPSULE | ORAL | Status: DC | PRN

## 2013-10-20 MED ORDER — DIBUCAINE 1 % RE OINT: 1.0000 "application " | TOPICAL_OINTMENT | RECTAL | Status: DC | PRN

## 2013-10-20 MED ORDER — MEPERIDINE HCL 25 MG/ML IJ SOLN: 6.2500 mg | INTRAMUSCULAR | Status: DC | PRN

## 2013-10-20 MED ORDER — GABAPENTIN 300 MG PO CAPS
600.0000 mg | ORAL_CAPSULE | Freq: Once | ORAL | Status: AC
  Administered 2013-10-20: 600 mg via ORAL
  Filled 2013-10-20: qty 2

## 2013-10-20 MED ORDER — FLEET ENEMA 7-19 GM/118ML RE ENEM: 1.0000 | ENEMA | Freq: Every day | RECTAL | Status: DC | PRN

## 2013-10-20 MED ORDER — SIMETHICONE 80 MG PO CHEW
80.0000 mg | CHEWABLE_TABLET | Freq: Three times a day (TID) | ORAL | Status: DC
  Administered 2013-10-20 – 2013-10-22 (×7): 80 mg via ORAL
  Filled 2013-10-20 (×7): qty 1

## 2013-10-20 MED ORDER — ONDANSETRON HCL 4 MG/2ML IJ SOLN: 4.0000 mg | INTRAMUSCULAR | Status: DC | PRN

## 2013-10-20 MED ORDER — IBUPROFEN 600 MG PO TABS: 600.0000 mg | ORAL_TABLET | Freq: Four times a day (QID) | ORAL | Status: DC

## 2013-10-20 MED ORDER — FENTANYL CITRATE 0.05 MG/ML IJ SOLN
INTRAMUSCULAR | Status: DC | PRN
  Administered 2013-10-20: 25 ug via EPIDURAL

## 2013-10-20 MED ORDER — OXYCODONE-ACETAMINOPHEN 5-325 MG PO TABS
1.0000 | ORAL_TABLET | ORAL | Status: DC | PRN
  Administered 2013-10-21 – 2013-10-22 (×7): 2 via ORAL
  Administered 2013-10-22: 1 via ORAL
  Filled 2013-10-20 (×9): qty 2

## 2013-10-20 MED ORDER — WITCH HAZEL-GLYCERIN EX PADS: 1.0000 "application " | MEDICATED_PAD | CUTANEOUS | Status: DC | PRN

## 2013-10-20 MED ORDER — DIPHENHYDRAMINE HCL 50 MG/ML IJ SOLN: 12.5000 mg | INTRAMUSCULAR | Status: DC | PRN

## 2013-10-20 MED ORDER — BISACODYL 10 MG RE SUPP: 10.0000 mg | Freq: Every day | RECTAL | Status: DC | PRN

## 2013-10-20 MED ORDER — OXYTOCIN 40 UNITS IN LACTATED RINGERS INFUSION - SIMPLE MED
62.5000 mL/h | INTRAVENOUS | Status: AC
Start: 2013-10-20 — End: 2013-10-21

## 2013-10-20 MED ORDER — CEFAZOLIN SODIUM-DEXTROSE 2-3 GM-% IV SOLR
INTRAVENOUS | Status: AC
  Filled 2013-10-20: qty 50

## 2013-10-20 MED ORDER — LACTATED RINGERS IV SOLN
INTRAVENOUS | Status: DC
  Administered 2013-10-20 (×2): via INTRAVENOUS

## 2013-10-20 MED ORDER — KETOROLAC TROMETHAMINE 30 MG/ML IJ SOLN
30.0000 mg | Freq: Once | INTRAMUSCULAR | Status: AC
  Administered 2013-10-20: 30 mg via INTRAVENOUS
  Filled 2013-10-20: qty 1

## 2013-10-20 MED ORDER — SCOPOLAMINE 1 MG/3DAYS TD PT72
1.0000 | MEDICATED_PATCH | Freq: Once | TRANSDERMAL | Status: DC
  Administered 2013-10-20: 1.5 mg via TRANSDERMAL

## 2013-10-20 MED ORDER — DEXTROSE 5 % IV SOLN: 1.0000 ug/kg/h | INTRAVENOUS | Status: DC | PRN

## 2013-10-20 MED ORDER — SODIUM CHLORIDE 0.9 % IJ SOLN
10.0000 mL | Freq: Once | INTRAMUSCULAR | Status: AC
  Administered 2013-10-20: 10 mL via INTRAVENOUS

## 2013-10-20 MED ORDER — LACTATED RINGERS IV SOLN
INTRAVENOUS | Status: DC
  Administered 2013-10-21 (×2): via INTRAVENOUS

## 2013-10-20 MED ORDER — SCOPOLAMINE 1 MG/3DAYS TD PT72
MEDICATED_PATCH | TRANSDERMAL | Status: AC
  Administered 2013-10-20: 1.5 mg via TRANSDERMAL
  Filled 2013-10-20: qty 1

## 2013-10-20 MED ORDER — MENTHOL 3 MG MT LOZG: 1.0000 | LOZENGE | OROMUCOSAL | Status: DC | PRN

## 2013-10-20 MED ORDER — SCOPOLAMINE 1 MG/3DAYS TD PT72
1.0000 | MEDICATED_PATCH | Freq: Once | TRANSDERMAL | Status: DC
  Filled 2013-10-20: qty 1

## 2013-10-20 MED ORDER — SENNOSIDES-DOCUSATE SODIUM 8.6-50 MG PO TABS
2.0000 | ORAL_TABLET | ORAL | Status: DC
  Administered 2013-10-20 – 2013-10-21 (×2): 2 via ORAL
  Filled 2013-10-20 (×2): qty 2

## 2013-10-20 MED ORDER — KETOROLAC TROMETHAMINE 30 MG/ML IJ SOLN
INTRAMUSCULAR | Status: AC
  Administered 2013-10-20: 30 mg via INTRAMUSCULAR
  Filled 2013-10-20: qty 1

## 2013-10-20 MED ORDER — ACETAMINOPHEN 500 MG PO TABS
1000.0000 mg | ORAL_TABLET | Freq: Once | ORAL | Status: AC
  Administered 2013-10-20: 1000 mg via ORAL
  Filled 2013-10-20: qty 2

## 2013-10-20 MED ORDER — LACTATED RINGERS IV SOLN
INTRAVENOUS | Status: DC
  Administered 2013-10-20: 14:00:00 via INTRAVENOUS

## 2013-10-20 MED ORDER — BUPIVACAINE HCL (PF) 0.25 % IJ SOLN
INTRAMUSCULAR | Status: AC
  Filled 2013-10-20: qty 40

## 2013-10-20 MED ORDER — PRENATAL MULTIVITAMIN CH
1.0000 | ORAL_TABLET | Freq: Every day | ORAL | Status: DC
  Administered 2013-10-21 – 2013-10-22 (×2): 1 via ORAL
  Filled 2013-10-20 (×2): qty 1

## 2013-10-20 MED ORDER — MORPHINE SULFATE 0.5 MG/ML IJ SOLN
INTRAMUSCULAR | Status: AC
  Filled 2013-10-20: qty 10

## 2013-10-20 MED ORDER — TETANUS-DIPHTH-ACELL PERTUSSIS 5-2.5-18.5 LF-MCG/0.5 IM SUSP: 0.5000 mL | Freq: Once | INTRAMUSCULAR | Status: DC

## 2013-10-20 MED ORDER — KETOROLAC TROMETHAMINE 30 MG/ML IJ SOLN: 30.0000 mg | Freq: Four times a day (QID) | INTRAMUSCULAR | Status: AC | PRN

## 2013-10-20 MED ORDER — HYDROMORPHONE HCL PF 1 MG/ML IJ SOLN
0.2500 mg | INTRAMUSCULAR | Status: DC | PRN
  Administered 2013-10-20 (×4): 0.5 mg via INTRAVENOUS

## 2013-10-20 MED ORDER — ONDANSETRON HCL 4 MG/2ML IJ SOLN: 4.0000 mg | Freq: Three times a day (TID) | INTRAMUSCULAR | Status: DC | PRN

## 2013-10-20 MED ORDER — PHENYLEPHRINE 8 MG IN D5W 100 ML (0.08MG/ML) PREMIX OPTIME
INJECTION | INTRAVENOUS | Status: AC
  Filled 2013-10-20: qty 100

## 2013-10-20 MED ORDER — BUPIVACAINE HCL (PF) 0.25 % IJ SOLN
INTRAMUSCULAR | Status: AC
  Filled 2013-10-20: qty 10

## 2013-10-20 MED ORDER — LANOLIN HYDROUS EX OINT
1.0000 | TOPICAL_OINTMENT | CUTANEOUS | Status: DC | PRN
Start: 2013-10-20 — End: 2013-10-22

## 2013-10-20 MED ORDER — PHENYLEPHRINE 8 MG IN D5W 100 ML (0.08MG/ML) PREMIX OPTIME
INJECTION | INTRAVENOUS | Status: DC | PRN
  Administered 2013-10-20: 60 ug/min via INTRAVENOUS

## 2013-10-20 MED ORDER — KETOROLAC TROMETHAMINE 30 MG/ML IJ SOLN
30.0000 mg | Freq: Four times a day (QID) | INTRAMUSCULAR | Status: AC | PRN
  Administered 2013-10-20: 30 mg via INTRAMUSCULAR

## 2013-10-20 MED ORDER — OXYTOCIN 10 UNIT/ML IJ SOLN
INTRAMUSCULAR | Status: AC
  Filled 2013-10-20: qty 4

## 2013-10-20 MED ORDER — ONDANSETRON HCL 4 MG/2ML IJ SOLN
INTRAMUSCULAR | Status: DC | PRN
  Administered 2013-10-20: 4 mg via INTRAVENOUS

## 2013-10-20 MED ORDER — IBUPROFEN 600 MG PO TABS
600.0000 mg | ORAL_TABLET | Freq: Four times a day (QID) | ORAL | Status: DC
  Administered 2013-10-20 – 2013-10-22 (×8): 600 mg via ORAL
  Filled 2013-10-20 (×8): qty 1

## 2013-10-20 MED ORDER — SIMETHICONE 80 MG PO CHEW
80.0000 mg | CHEWABLE_TABLET | ORAL | Status: DC | PRN
Start: 2013-10-20 — End: 2013-10-22
  Filled 2013-10-20: qty 1

## 2013-10-20 MED ORDER — BUPIVACAINE LIPOSOME 1.3 % IJ SUSP
Freq: Once | INTRAMUSCULAR | Status: AC
  Administered 2013-10-20: 16:00:00
  Filled 2013-10-20 (×2): qty 20

## 2013-10-20 MED ORDER — MORPHINE SULFATE (PF) 0.5 MG/ML IJ SOLN
INTRAMUSCULAR | Status: DC | PRN
  Administered 2013-10-20: .15 mg via EPIDURAL

## 2013-10-20 MED ORDER — SODIUM CHLORIDE 0.9 % IJ SOLN
INTRAMUSCULAR | Status: AC
  Filled 2013-10-20: qty 50

## 2013-10-20 MED ORDER — OXYTOCIN 10 UNIT/ML IJ SOLN
40.0000 [IU] | INTRAMUSCULAR | Status: DC | PRN
  Administered 2013-10-20: 40 [IU] via INTRAVENOUS

## 2013-10-20 MED ORDER — NALOXONE HCL 0.4 MG/ML IJ SOLN: 0.4000 mg | INTRAMUSCULAR | Status: DC | PRN

## 2013-10-20 MED ORDER — ZOLPIDEM TARTRATE 5 MG PO TABS: 5.0000 mg | ORAL_TABLET | Freq: Every evening | ORAL | Status: DC | PRN

## 2013-10-20 MED ORDER — DIPHENHYDRAMINE HCL 50 MG/ML IJ SOLN: 25.0000 mg | INTRAMUSCULAR | Status: DC | PRN

## 2013-10-20 MED ORDER — ONDANSETRON HCL 4 MG PO TABS: 4.0000 mg | ORAL_TABLET | ORAL | Status: DC | PRN

## 2013-10-20 MED ORDER — LACTATED RINGERS IV SOLN: INTRAVENOUS | Status: DC

## 2013-10-20 SURGICAL SUPPLY — 38 items
BENZOIN TINCTURE PRP APPL 2/3 (GAUZE/BANDAGES/DRESSINGS) ×3 IMPLANT
CLAMP CORD UMBIL (MISCELLANEOUS) IMPLANT
CLIP FILSHIE TUBAL LIGA STRL (Clip) ×3 IMPLANT
CLOSURE WOUND 1/2 X4 (GAUZE/BANDAGES/DRESSINGS) ×1
CLOTH BEACON ORANGE TIMEOUT ST (SAFETY) ×3 IMPLANT
DECANTER SPIKE VIAL GLASS SM (MISCELLANEOUS) ×3 IMPLANT
DRAPE LG THREE QUARTER DISP (DRAPES) IMPLANT
DRSG OPSITE POSTOP 4X10 (GAUZE/BANDAGES/DRESSINGS) ×3 IMPLANT
DURAPREP 26ML APPLICATOR (WOUND CARE) ×3 IMPLANT
ELECT REM PT RETURN 9FT ADLT (ELECTROSURGICAL) ×3
ELECTRODE REM PT RTRN 9FT ADLT (ELECTROSURGICAL) ×1 IMPLANT
EXTRACTOR VACUUM M CUP 4 TUBE (SUCTIONS) IMPLANT
EXTRACTOR VACUUM M CUP 4' TUBE (SUCTIONS)
GLOVE BIOGEL PI IND STRL 7.0 (GLOVE) ×1 IMPLANT
GLOVE BIOGEL PI INDICATOR 7.0 (GLOVE) ×2
GLOVE ECLIPSE 7.0 STRL STRAW (GLOVE) ×6 IMPLANT
GOWN STRL NON-REIN LRG LVL3 (GOWN DISPOSABLE) ×3 IMPLANT
GOWN STRL REUS W/TWL LRG LVL3 (GOWN DISPOSABLE) ×6 IMPLANT
KIT ABG SYR 3ML LUER SLIP (SYRINGE) IMPLANT
NEEDLE HYPO 22GX1.5 SAFETY (NEEDLE) ×3 IMPLANT
NEEDLE HYPO 25X5/8 SAFETYGLIDE (NEEDLE) IMPLANT
NS IRRIG 1000ML POUR BTL (IV SOLUTION) ×3 IMPLANT
PACK C SECTION WH (CUSTOM PROCEDURE TRAY) ×3 IMPLANT
PAD ABD 7.5X8 STRL (GAUZE/BANDAGES/DRESSINGS) ×3 IMPLANT
PAD OB MATERNITY 4.3X12.25 (PERSONAL CARE ITEMS) ×3 IMPLANT
RTRCTR C-SECT PINK 25CM LRG (MISCELLANEOUS) IMPLANT
STAPLER VISISTAT 35W (STAPLE) IMPLANT
STRIP CLOSURE SKIN 1/2X4 (GAUZE/BANDAGES/DRESSINGS) ×2 IMPLANT
SUT MNCRL 0 VIOLET CTX 36 (SUTURE) ×1 IMPLANT
SUT MONOCRYL 0 CTX 36 (SUTURE) ×2
SUT VIC AB 0 CTX 36 (SUTURE) ×6
SUT VIC AB 0 CTX36XBRD ANBCTRL (SUTURE) ×3 IMPLANT
SUT VIC AB 4-0 KS 27 (SUTURE) ×3 IMPLANT
SYR 30ML LL (SYRINGE) ×3 IMPLANT
TAPE HYPAFIX 4 X10 (GAUZE/BANDAGES/DRESSINGS) ×3 IMPLANT
TOWEL OR 17X24 6PK STRL BLUE (TOWEL DISPOSABLE) ×3 IMPLANT
TRAY FOLEY CATH 14FR (SET/KITS/TRAYS/PACK) ×3 IMPLANT
WATER STERILE IRR 1000ML POUR (IV SOLUTION) ×3 IMPLANT

## 2013-10-20 NOTE — Progress Notes (Signed)
Called to evaluate pt for severe pain. Pt hx of suboxone use with last dose approximately 1 week ago. Pt s/p C-section, bleeding appropriate but pt having difficulty tolerating fundal massage with moderate to heavy lochia. Pit still running.  Pt had Exparel around incision (currently numb to patient), and  rec'd Toradol 30mg  x1, dilaudid 0.5mg  x2 in PACU  Discussed case with Dr. Jean RosenthalJackson for medications to try to aid in pain. Will maximize Toradol with 30mg  additional for total of 60mg . Will also give 1000mg  of APAP for synergetic effect with narcotics. Will also give gabapentin 600mg  x1. Reviewed penetrance and low breast milk expression and safe in breastfeeding in doses <2.1grams.   If still unable to control pain, Dr. Jean RosenthalJackson discussed a possible low dose ketamine in saline on the floor that he would discuss with oncoming provider.   Will sign out to oncoming provider.  Tawana ScaleMichael Ryan Catharina Pica, MD OB Fellow

## 2013-10-20 NOTE — Anesthesia Postprocedure Evaluation (Signed)
  Anesthesia Post-op Note  Patient: Sara Farrell  Procedure(s) Performed: Procedure(s): CESAREAN SECTION WITH BILATERAL TUBAL LIGATION (Bilateral)  Patient is awake, responsive, moving her legs, and has signs of resolution of her numbness. Pain and nausea are reasonably well controlled. Vital signs are stable and clinically acceptable. Oxygen saturation is clinically acceptable. There are no apparent anesthetic complications at this time. Patient is ready for discharge.

## 2013-10-20 NOTE — Op Note (Signed)
Sara PetersJacqueline Kunda PROCEDURE DATE: 10/20/2013  PREOPERATIVE DIAGNOSES: Intrauterine pregnancy at  6246w0d weeks gestation; previous uterine incision kronig; undesired fertility  POSTOPERATIVE DIAGNOSES: The same  PROCEDURE: Repeat Low Transverse Cesarean Section, Bilateral Tubal Sterilization using Filshie clips  SURGEON:  Dr. Tinnie Gensanya Pratt ASSISTANT:  Dr. Jolyn LentMichael Rishith Siddoway   INDICATIONS: Sara Farrell is a 23 y.o. G2P1001 at 2846w0d here for cesarean section and bilateral tubal sterilization secondary to the indications listed under preoperative diagnoses; please see preoperative note for further details.  The risks of surgery were discussed with the patient including but were not limited to: bleeding which may require transfusion or reoperation; infection which may require antibiotics; injury to bowel, bladder, ureters or other surrounding organs; injury to the fetus; need for additional procedures including hysterectomy in the event of a life-threatening hemorrhage; placental abnormalities wth subsequent pregnancies, incisional problems, thromboembolic phenomenon and other postoperative/anesthesia complications.  Patient also desires permanent sterilization.  Other reversible forms of contraception were discussed with patient; she declines all other modalities. Risks of procedure discussed with patient including but not limited to: risk of regret, permanence of method, bleeding, infection, injury to surrounding organs and need for additional procedures.  Failure risk of 0.5-1% with increased risk of ectopic gestation if pregnancy occurs was also discussed with patient.  The patient concurred with the proposed plan, giving informed written consent for the procedures.    FINDINGS:  Viable female infant in cephalic presentation.  Apgars 8 and 9.  Clear amniotic fluid.  Intact placenta, three vessel cord.  Normal uterus, fallopian tubes and ovaries bilaterally.  ANESTHESIA: Spinal INTRAVENOUS FLUIDS: 1000  ml ESTIMATED BLOOD LOSS: 750 ml URINE OUTPUT:  150 ml SPECIMENS: Placenta sent to L&D COMPLICATIONS: None immediate  PROCEDURE IN DETAIL:  The patient preoperatively received intravenous antibiotics and had sequential compression devices applied to her lower extremities.   She was then taken to the operating room where spinal anesthesia was administered and was found to be adequate. She was then placed in a dorsal supine position with a leftward tilt, and prepped and draped in a sterile manner.  A foley catheter was placed into her bladder and attached to constant gravity.  After an adequate timeout was performed, a Pfannenstiel skin incision was made with scalpel and carried through to the underlying layer of fascia. The fascia was incised in the midline, and this incision was extended bilaterally using the Mayo scissors.  Kocher clamps were applied to the superior aspect of the fascial incision and the underlying rectus muscles were dissected off sharply. A similar process was carried out on the inferior aspect of the fascial incision. The rectus muscles were separated in the midline bluntly and the peritoneum was entered bluntly. Attention was turned to the lower uterine segment where a low transverse hysterotomy was made with a scalpel and extended bilaterally bluntly.  The infant was successfully delivered, the cord was clamped and cut and the infant was handed over to awaiting neonatology team. Uterine massage was then administered, and the placenta delivered intact with a three-vessel cord. The uterus was externalized then cleared of clot and debris.  The hysterotomy was closed with 0 Vicryl in a running locked fashion, and an imbricating layer was also placed with 0 monocryl.  Attention was then turned to the fallopian tubes.  A Filshie clip was placed on both tubes, about 3 cm from the cornua, with care given to incorporate the underlying mesosalpinx on both sides, allowing for bilateral tubal  sterilization. <1cc of eXperil was  injected in each fallopian tube. The pelvis was cleared of all clot and debris. Hemostasis was confirmed on all surfaces.  The peritoneum was reapproximated using 0 Vicryl running stitch. The fascia was then closed using 0 Vicryl in a running fashion.  The subcutaneous layer was irrigated, and 20 ml of 1.3% Exparel diluted 50/50 with NS was injected subcutaneously around the incision.  The skin was closed with a 4-0 Vicryl subcuticular stitch. The patient tolerated the procedure well. Sponge, lap, instrument and needle counts were correct x 2.  She was taken to the recovery room in stable condition.   Tawana Scale, MD OB Fellow

## 2013-10-20 NOTE — Transfer of Care (Signed)
Immediate Anesthesia Transfer of Care Note  Patient: Sara PetersJacqueline Down  Procedure(s) Performed: Procedure(s): CESAREAN SECTION WITH BILATERAL TUBAL LIGATION (Bilateral)  Patient Location: PACU  Anesthesia Type:Spinal  Level of Consciousness: awake, alert  and oriented  Airway & Oxygen Therapy: Patient Spontanous Breathing  Post-op Assessment: Report given to PACU RN and Post -op Vital signs reviewed and stable  Post vital signs: Reviewed and stable  Complications: No apparent anesthesia complications

## 2013-10-20 NOTE — Interval H&P Note (Signed)
History and Physical Interval Note:  10/20/2013 2:17 PM  Sara Farrell  has presented today for surgery, with the diagnosis of  REPEAT History of blood clot in right arm and may need anticoagulation  - Subuxone  The various methods of treatment have been discussed with the patient and family. After consideration of risks, benefits and other options for treatment, the patient has consented to  Procedure(s): CESAREAN SECTION WITH BILATERAL TUBAL LIGATION (Bilateral) as a surgical intervention .  The patient's history has been reviewed, patient examined, no change in status, stable for surgery.  I have reviewed the patient's chart and labs.  Questions were answered to the patient's satisfaction.     Trev Boley S

## 2013-10-20 NOTE — Anesthesia Procedure Notes (Signed)

## 2013-10-20 NOTE — Anesthesia Preprocedure Evaluation (Signed)
Anesthesia Evaluation  Patient identified by MRN, date of birth, ID band Patient awake    Reviewed: Allergy & Precautions, H&P , Patient's Chart, lab work & pertinent test results  Airway Mallampati: II TM Distance: >3 FB Neck ROM: full    Dental no notable dental hx.    Pulmonary Current Smoker,  breath sounds clear to auscultation  Pulmonary exam normal       Cardiovascular Exercise Tolerance: Good Rhythm:regular Rate:Normal     Neuro/Psych    GI/Hepatic   Endo/Other    Renal/GU      Musculoskeletal   Abdominal   Peds  Hematology   Anesthesia Other Findings   Reproductive/Obstetrics                           Anesthesia Physical Anesthesia Plan  ASA: II  Anesthesia Plan: Spinal   Post-op Pain Management:    Induction:   Airway Management Planned:   Additional Equipment:   Intra-op Plan:   Post-operative Plan:   Informed Consent: I have reviewed the patients History and Physical, chart, labs and discussed the procedure including the risks, benefits and alternatives for the proposed anesthesia with the patient or authorized representative who has indicated his/her understanding and acceptance.   Dental Advisory Given  Plan Discussed with: CRNA  Anesthesia Plan Comments: (Lab work confirmed with CRNA in room. Platelets okay. Discussed spinal anesthetic, and patient consents to the procedure:  included risk of possible headache,backache, failed block, allergic reaction, and nerve injury. This patient was asked if she had any questions or concerns before the procedure started. )        Anesthesia Quick Evaluation  

## 2013-10-20 NOTE — Op Note (Signed)
I was present for all aspects of this surgery and agree with Dr. Charletta Cousindom's note.

## 2013-10-21 ENCOUNTER — Encounter (HOSPITAL_COMMUNITY): Payer: Self-pay | Admitting: Family Medicine

## 2013-10-21 LAB — CBC
HCT: 23.5 % — ABNORMAL LOW (ref 36.0–46.0)
HEMATOCRIT: 22.9 % — AB (ref 36.0–46.0)
Hemoglobin: 7.8 g/dL — ABNORMAL LOW (ref 12.0–15.0)
Hemoglobin: 8.2 g/dL — ABNORMAL LOW (ref 12.0–15.0)
MCH: 30 pg (ref 26.0–34.0)
MCH: 30.6 pg (ref 26.0–34.0)
MCHC: 34.1 g/dL (ref 30.0–36.0)
MCHC: 34.9 g/dL (ref 30.0–36.0)
MCV: 88.1 fL (ref 78.0–100.0)
MCV: 87.7 fL (ref 78.0–100.0)
PLATELETS: 104 10*3/uL — AB (ref 150–400)
Platelets: 110 K/uL — ABNORMAL LOW (ref 150–400)
RBC: 2.6 MIL/uL — AB (ref 3.87–5.11)
RBC: 2.68 MIL/uL — ABNORMAL LOW (ref 3.87–5.11)
RDW: 12.8 % (ref 11.5–15.5)
RDW: 12.9 % (ref 11.5–15.5)
WBC: 8.9 10*3/uL (ref 4.0–10.5)
WBC: 9.3 K/uL (ref 4.0–10.5)

## 2013-10-21 MED ORDER — SODIUM CHLORIDE 0.9 % IJ SOLN
10.0000 mL | INTRAMUSCULAR | Status: DC | PRN
Start: 2013-10-21 — End: 2013-10-22
  Administered 2013-10-21: 10 mL

## 2013-10-21 MED ORDER — SODIUM CHLORIDE 0.9 % IJ SOLN
10.0000 mL | Freq: Two times a day (BID) | INTRAMUSCULAR | Status: DC
  Administered 2013-10-21 (×2): 10 mL
  Filled 2013-10-21: qty 40

## 2013-10-21 NOTE — Clinical Social Work Maternal (Signed)
Clinical Social Work Department PSYCHOSOCIAL ASSESSMENT - MATERNAL/CHILD 10/21/2013  Patient:  Sara Farrell,Sara Farrell  Account Number:  [id number]  Admit Date:  10/20/2013  Marjo Bickerhilds Name:   BB Andria MeuseStevens (Undecided on name)    Clinical Social Worker:  Nobie PutnamEDRA Selah Zelman, LCSW   Date/Time:  10/21/2013 01:31 PM  Date Referred:  10/21/2013   Referral source  CN     Referred reason  Substance Abuse   Other referral source:    I:  FAMILY / HOME ENVIRONMENT Child's legal guardian:  PARENT  Guardian - Name Guardian - Age Guardian - Address  Sara Farrell [address]; [place], [place] [id number]  Sara Farrell 30 (same as above)   Other household support members/support persons Other support:   Sara Farrell    II  PSYCHOSOCIAL DATA Information Source:  Patient Interview  Event organiserinancial and Community Resources Employment:   Surveyor, quantityinancial resources:  Media plannerrivate Insurance If [employer]icaid - [place]:   Other  [facility]   School / Grade:   Maternity Care Coordinator / Child Services Coordination / Early Interventions:  Cultural issues impacting care:    III  STRENGTHS Strengths  Adequate Resources  Home prepared for Child (including basic supplies)  Supportive family/friends   Strength comment:    IV  RISK FACTORS AND CURRENT PROBLEMS Current Problem:  YES   Risk Factor & Current Problem Patient Issue Family Issue Risk Factor / Current Problem Comment  Substance Abuse Y N Hx of opoid abuse, MJ  Other - See comment Y N 2 months w/o PNC    V  SOCIAL WORK ASSESSMENT CSW referral received to assess pts history of substance abuse & offer resources as needed.  Pt is a 23 year old, G2P2 who lives [place] in [place], [place].  Pt appeared alert & oriented when CSW entered her room to discuss substance abuse history.  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  Pt told CSW that she was prescribed percocet for tooth pain 3 years  ago & started to abuse the opiates (Percocet's & Roxy's).  She admits to 4 months of abuse before she was able to maintain sobriety.  CSW inquired about details of pts Suboxone treatment  & dosage.  Pt told CSW that she started Suboxone treatment before the pregnancy however medication was not prescribed.  Pt explained that she was told by a friend that Suboxone would stop "cravings" & she started purchasing the medication from her.  Pt admits to buying 8mg  of Suboxone off the street for "at least one year,"  until 2 weeks ago.  She admits to daily use during pregnancy.  Since pt reports sobriety from opiates for 3 years, this Clinical research associatewriter asked pt if she continued to experience cravings after years of no use.  Pt responded & said "it helped with mental cravings not physical."  CSW inquired about illegal substance use & she said "no."  Pts UDS is positive for MJ & opiates.  CSW informed pt of drug screen results & she appeared shocked.  She told admitted to smoking MJ in the beginning of pregnancy but denies any recent use.  Pt told CSW that her fiance smokes MJ around her, stating that could be the cause of positive results.  Pt received opiates during admission prior to drug screen.  Per chart review, pt was positive for substances on 10/08/13.  CSW explained hospital drug testing policy & pt verbalized an understanding.  UDS is  negative, meconium results are pending.  Pt denies any depression or SI history.  She has a son, Sara Farrell (DOB 01/01/07), who lives with her Farrell.  She denies any previous Child Protective Services history.  Pt has all the necessary supplies for the infant.  She identifies her fiance, as per primary support person.  CSW questions pts explanation of history & frequency of substance use.  CSW strongly encouraged pt to be truthful, as it is necessary for the medical team to provide adequate care to the infant.  Pt did not offer more information.  Pt states she is aware that the infant may  experience withdrawals, as she discussed with her OBGYN.  CSW is concerned about the infant discharging to an inappropriate environment & reported concerns to [facility].  CSW was informed by Aloha Gell, CPS supervisor that the case was screened out.  CSW will continue to monitor drug screen results & assist family until discharge.      VI SOCIAL WORK PLAN Social Work Plan  Child Management consultant Report  Psychosocial Support/Ongoing Assessment of Needs   Type of pt/family education:   If child protective services report - county:  [place] If child protective services report - date:  10/21/2013 Information/referral to community resources comment:   Other social work plan:

## 2013-10-21 NOTE — Lactation Note (Signed)
This note was copied from the chart of [name]. Lactation Consultation Note  Patient Name: [name] ZOXWR'UToday's Date: 10/21/2013 Reason for consult: Initial assessment;Other (Comment) (charting for exclusion)   Maternal Data Formula Feeding for Exclusion: Yes Reason for exclusion: Mother's choice to formula feed on admision  Feeding Feeding Type: Bottle Fed - Formula  LATCH Score/Interventions                      Lactation Tools Discussed/Used     Consult Status Consult Status: Complete    Lynda RainwaterBryant, Stephenie Navejas Parm[place] 10/21/2013, 4:26 PM

## 2013-10-21 NOTE — Progress Notes (Signed)
Subjective: Postpartum Day 1: Cesarean Delivery Patient reports tolerating PO.  Foley still in, NO flatus yet  Objective: Vital signs in last 24 hours: Temp:  [97.5 F (36.4 C)-98.2 F (36.8 C)] 98.2 F (36.8 C) (02/25 0553) Pulse Rate:  [59-130] 74 (02/25 0553) Resp:  [13-23] 16 (02/25 0553) BP: (83-117)/(45-81) 83/52 mmHg (02/25 0553) SpO2:  [94 %-100 %] 98 % (02/25 0553)  Physical Exam:  General: alert, cooperative and appears stated age Lochia: appropriate Uterine Fundus: firm Incision: no significant drainage DVT Evaluation: No evidence of DVT seen on physical exam. Negative Homan's sign. No cords or calf tenderness.   Recent Labs  10/20/13 1340  HGB 11.8*  HCT 35.0*    Assessment/Plan: Status post Cesarean section. Doing well postoperatively. Initially with increased pain, now well controlled.   Continue current care.  Kevin FentonBradshaw, Samuel 10/21/2013, 7:25 AM

## 2013-10-21 NOTE — Anesthesia Postprocedure Evaluation (Signed)
Anesthesia Post Note  Patient: Sara PetersJacqueline Farrell  Procedure(s) Performed: Procedure(s) (LRB): CESAREAN SECTION WITH BILATERAL TUBAL LIGATION (Bilateral)  Anesthesia type: SAB  Patient location: Mother/Baby  Post pain: Pain level controlled  Post assessment: Post-op Vital signs reviewed  Last Vitals:  Filed Vitals:   10/21/13 0553  BP: 83/52  Pulse: 74  Temp: 36.8 C  Resp: 16    Post vital signs: Reviewed  Level of consciousness: awake  Complications: No apparent anesthesia complications

## 2013-10-21 NOTE — Progress Notes (Signed)
Attestation of Attending Supervision of Resident: Evaluation and management procedures were performed by the [facility] Resident under my supervision.  I have seen and examined the patient, reviewed the resident's note and chart, and I agree with the management and plan.  Anibal Hendersonarolyn L Harraway-Smith, M.D. 10/21/2013 11:30 AM

## 2013-10-21 NOTE — Addendum Note (Signed)
Addendum created 10/21/13 0755 by Jhonnie GarnerBeth M Clyde Upshaw, CRNA   Modules edited: Notes Section   Notes Section:  File: [id number]

## 2013-10-22 ENCOUNTER — Encounter (HOSPITAL_COMMUNITY): Payer: Self-pay | Admitting: *Deleted

## 2013-10-22 MED ORDER — IBUPROFEN 600 MG PO TABS: 600.0000 mg | ORAL_TABLET | Freq: Four times a day (QID) | ORAL | Status: AC

## 2013-10-22 MED ORDER — BISACODYL 10 MG RE SUPP: 10.0000 mg | Freq: Every day | RECTAL | Status: AC | PRN

## 2013-10-22 MED ORDER — OXYCODONE-ACETAMINOPHEN 5-325 MG PO TABS: 1.0000 | ORAL_TABLET | ORAL | Status: AC | PRN

## 2013-10-22 NOTE — Discharge Summary (Signed)
Physician Obstetric Discharge Summary  Patient ID: Sara Farrell MRN: [id number] DOB/AGE: 1990/09/10 23 y.o.  Reason for Admission: cesarean section Prenatal Procedures: none Intrapartum Procedures: cesarean: low cervical, transverse and tubal ligation Postpartum Procedures: none Complications-Operative and Postpartum: none  Delivery Note PROCEDURE: Repeat Low Transverse Cesarean Section, Bilateral Tubal Sterilization using Filshie clips  SURGEON: Dr. Tinnie Gensanya Pratt  ASSISTANT: Dr. Jolyn LentMichael Rajni Holsworth  H/H:  Lab Results  Component Value Date/Time   HGB 8.2* 10/21/2013 12:25 PM   HCT 23.5* 10/21/2013 12:25 PM    Brief Hospital Course: Sara PetersJacqueline Cumbo is a [id number] who underwent cesarean section w/ BTL on 10/20/2013.  Patient had an uncomplicated surgery; for further details of this surgery, please refer to the operative note. Following surgery pt had pain out of proportion that did not respond to 1mg  dilaudid. Able to controll with full dose toradol, APAP, and 1 dose of gabapentin.  Patient had an uncomplicated postpartum course.  By time of discharge on POD#2/PPD#2, her pain was controlled on oral pain medications; she had appropriate lochia and was ambulating, voiding without difficulty, tolerating regular diet and passing flatus.   She was deemed stable for discharge to home.    Physical Exam:  General: alert, cooperative and no distress Lochia:normal flow Chest: CTAB Heart: RRR no m/r/g Abdomen: +BS, soft, nontender, Incision: clean and dry Uterine Fundus: firm DVT Evaluation: No evidence of DVT seen on physical exam. Extremities: no edema  Discharge Diagnoses: Term Pregnancy-delivered  Discharge Information: Date: 10/22/2013 Activity: pelvic rest Diet: routine Baby feeding: plans to bottle feed Contraception: bilateral tubal ligation Medications: PNV, Ibuprofen, Colace and Percocet Discharged Condition: stable Instructions: refer to practice specific booklet Discharge to:  home  Signed: Wenda LowJames Joyner, M.D. FM PGY-1  10/22/2013, 7:53 AM  I spoke with and examined patient and agree with resident's note and plan of care.  Tawana ScaleMichael Ryan Temia Debroux, MD OB Fellow 10/22/2013 11:03 AM

## 2013-10-22 NOTE — Discharge Instructions (Signed)
Postpartum Care After Cesarean Delivery °After you deliver your newborn (postpartum period), the usual stay in the hospital is 24 72 hours. If there were problems with your labor or delivery, or if you have other medical problems, you might be in the hospital longer.  °While you are in the hospital, you will receive help and instructions on how to care for yourself and your newborn during the postpartum period.  °While you are in the hospital: °· It is normal for you to have pain or discomfort from the incision in your abdomen. Be sure to tell your nurses when you are having pain, where the pain is located, and what makes the pain worse. °· If you are breastfeeding, you may feel uncomfortable contractions of your uterus for a couple of weeks. This is normal. The contractions help your uterus get back to normal size. °· It is normal to have some bleeding after delivery. °· For the first 1 3 days after delivery, the flow is red and the amount may be similar to a period. °· It is common for the flow to start and stop. °· In the first few days, you may pass some small clots. Let your nurses know if you begin to pass large clots or your flow increases. °· Do not  flush blood clots down the toilet before having the nurse look at them. °· During the next 3 10 days after delivery, your flow should become more watery and pink or brown-tinged in color. °· Ten to fourteen days after delivery, your flow should be a small amount of yellowish-white discharge. °· The amount of your flow will decrease over the first few weeks after delivery. Your flow may stop in 6 8 weeks. Most women have had their flow stop by 12 weeks after delivery. °· You should change your sanitary pads frequently. °· Wash your hands thoroughly with soap and water for at least 20 seconds after changing pads, using the toilet, or before holding or feeding your newborn. °· Your intravenous (IV) tubing will be removed when you are drinking enough fluids. °· The  urine drainage tube (urinary catheter) that was inserted before delivery may be removed within 6 8 hours after delivery or when feeling returns to your legs. You should feel like you need to empty your bladder within the first 6 8 hours after the catheter has been removed. °· In case you become weak, lightheaded, or faint, call your nurse before you get out of bed for the first time and before you take a shower for the first time. °· Within the first few days after delivery, your breasts may begin to feel tender and full. This is called engorgement. Breast tenderness usually goes away within 48 72 hours after engorgement occurs. You may also notice milk leaking from your breasts. If you are not breastfeeding, do not stimulate your breasts. Breast stimulation can make your breasts produce more milk. °· Spending as much time as possible with your newborn is very important. During this time, you and your newborn can feel close and get to know each other. Having your newborn stay in your room (rooming in) will help to strengthen the bond with your newborn. It will give you time to get to know your newborn and become comfortable caring for your newborn. °· Your hormones change after delivery. Sometimes the hormone changes can temporarily cause you to feel sad or tearful. These feelings should not last more than a few days. If these feelings last longer   than that, you should talk to your caregiver. °· If desired, talk to your caregiver about methods of family planning or contraception. °· Talk to your caregiver about immunizations. Your caregiver may want you to have the following immunizations before leaving the hospital: °· Tetanus, diphtheria, and pertussis (Tdap) or tetanus and diphtheria (Td) immunization. It is very important that you and your family (including grandparents) or others caring for your newborn are up-to-date with the Tdap or Td immunizations. The Tdap or Td immunization can help protect your newborn  from getting ill. °· Rubella immunization. °· Varicella (chickenpox) immunization. °· Influenza immunization. You should receive this annual immunization if you did not receive the immunization during your pregnancy. °Document Released: 05/07/2012 Document Reviewed: 05/07/2012 °[employer]® Patient Information ©2014 [employer]. ° °

## 2014-06-28 ENCOUNTER — Encounter (HOSPITAL_COMMUNITY): Payer: Self-pay | Admitting: *Deleted

## 2014-08-05 IMAGING — US US OB FOLLOW-UP
1 series · 12 of 28 positions shown · non-contrast
Comparison: none

[Series 1: us ob follow-up · 0.26mm/px · 12 of 38 slices shown]
[im 2/38]
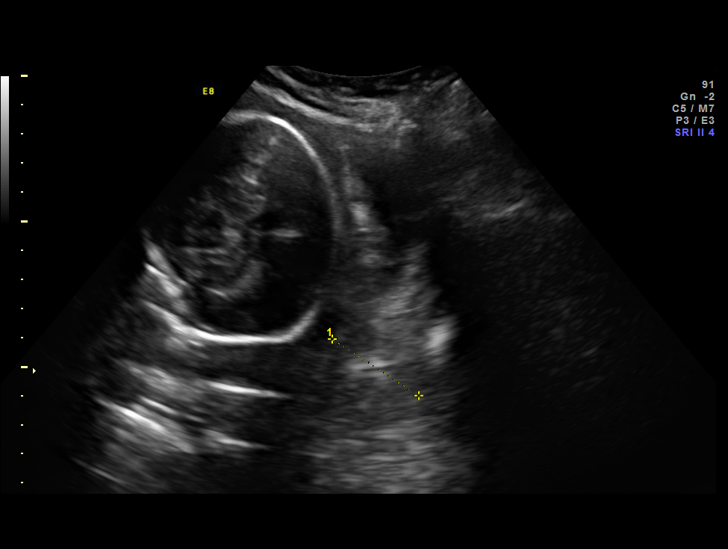
[im 5/38]
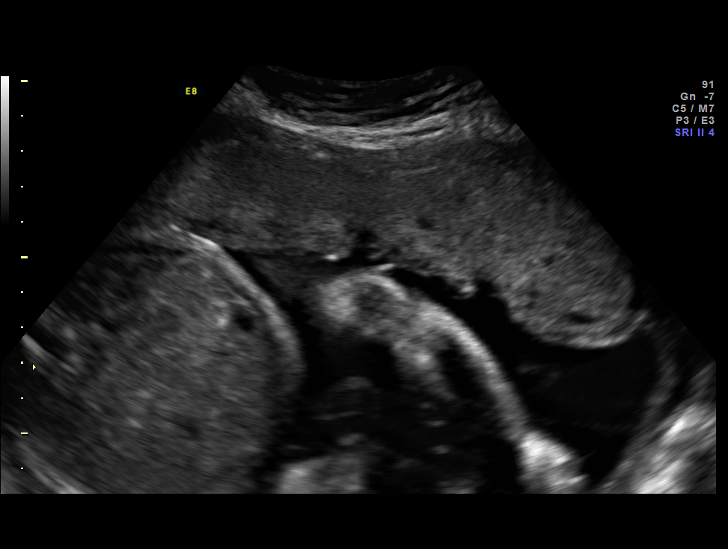
[im 7/38]
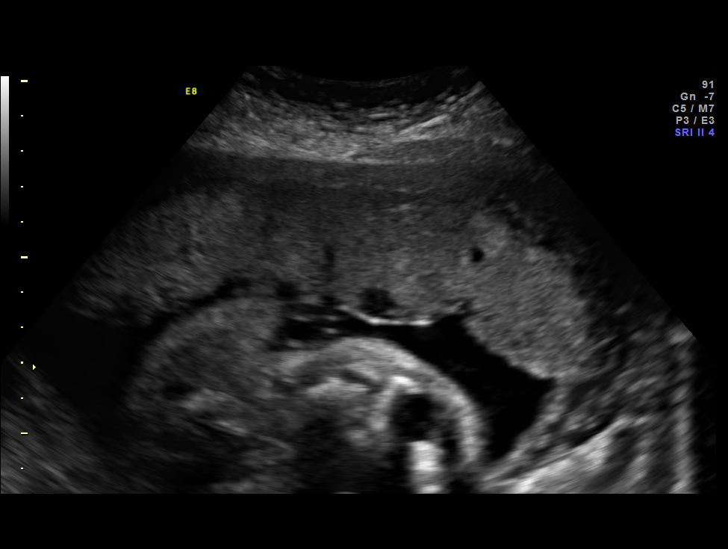
[im 11/38]
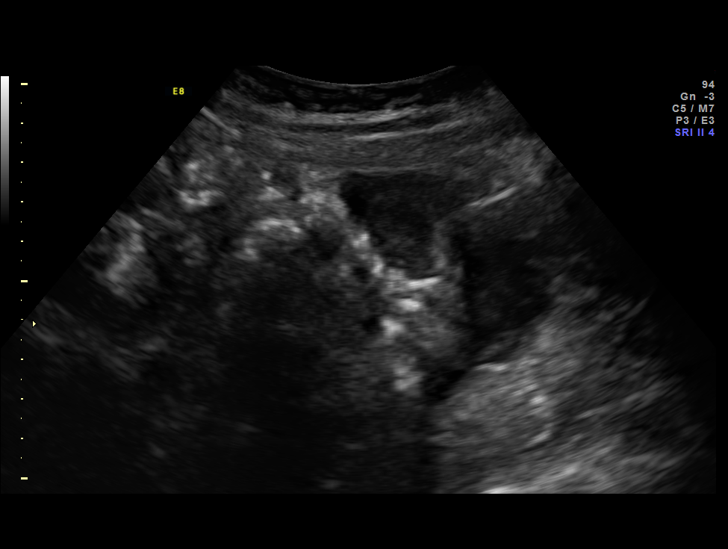
[im 14/38]
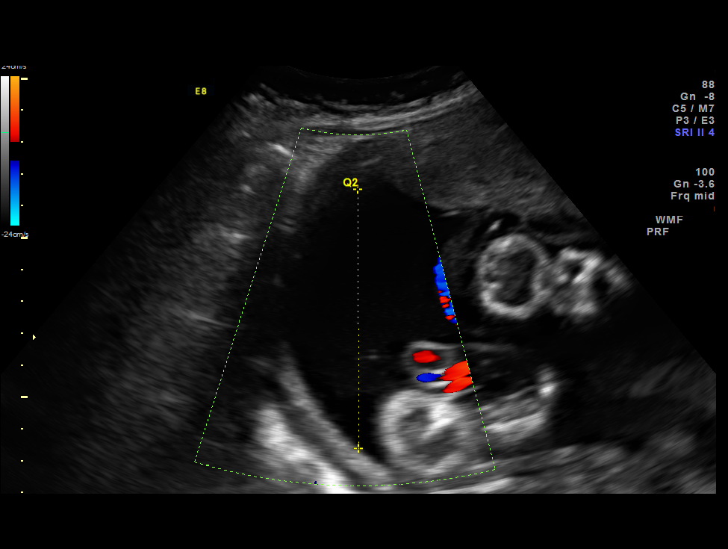
[im 17/38]
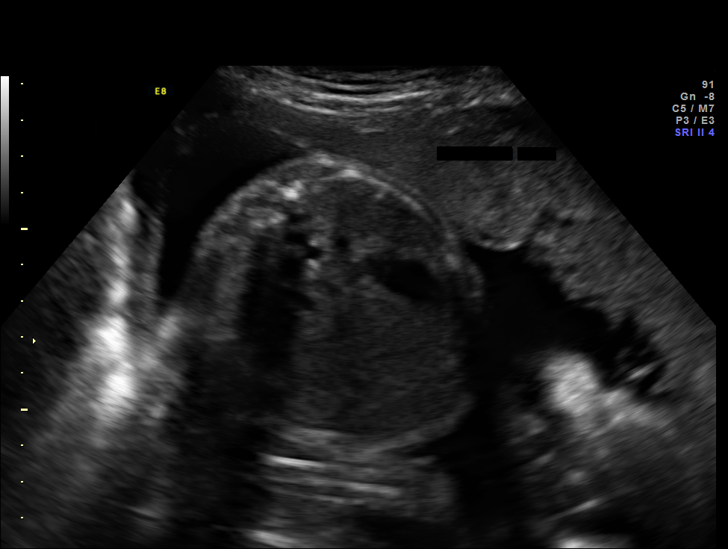
[im 21/38]
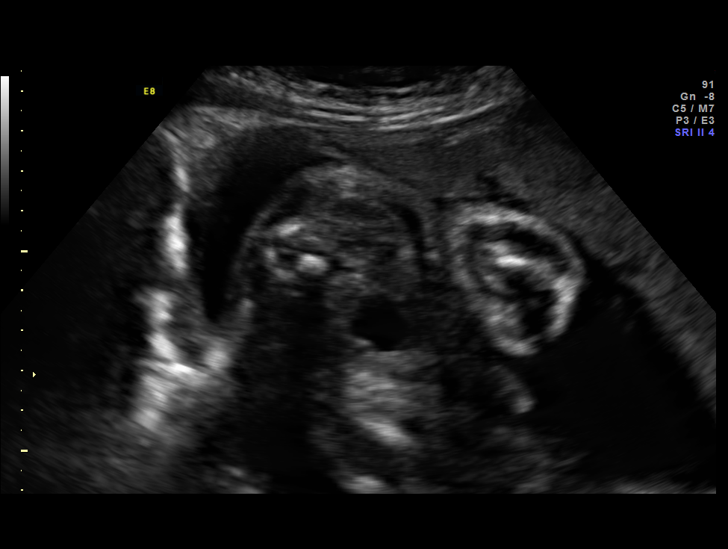
[im 24/38]
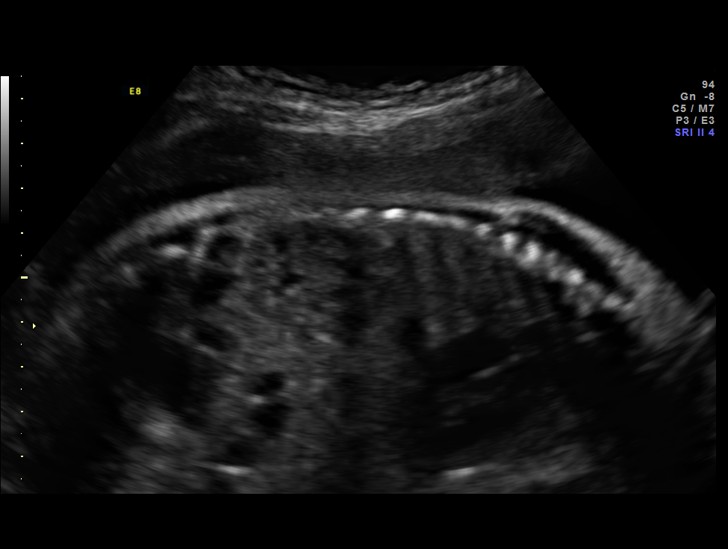
[im 27/38]
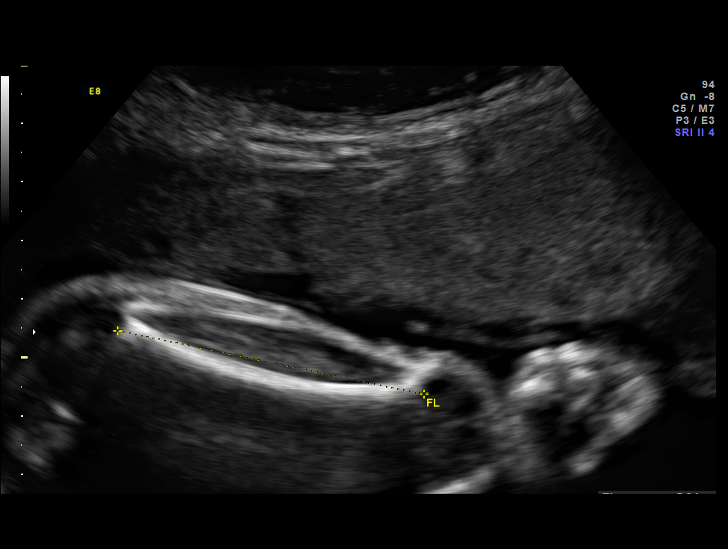
[im 31/38]
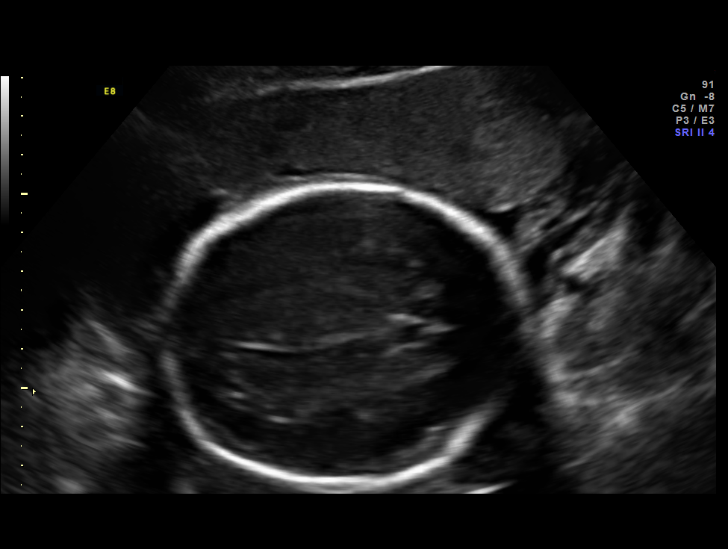
[im 33/38]
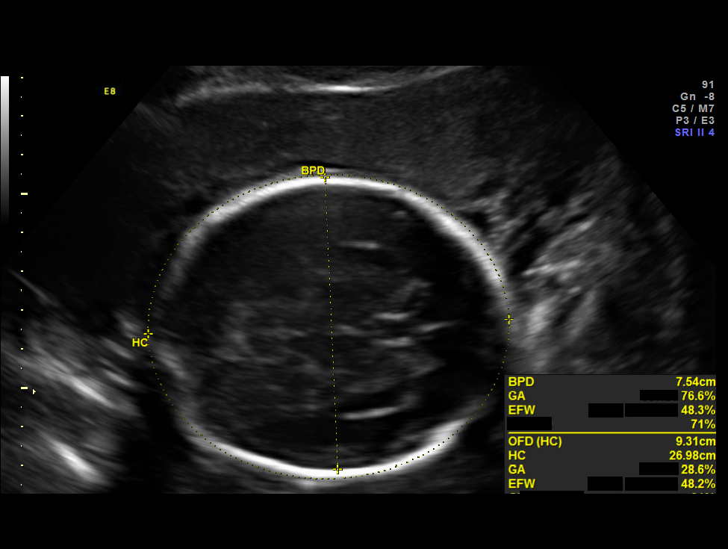
[im 36/38]
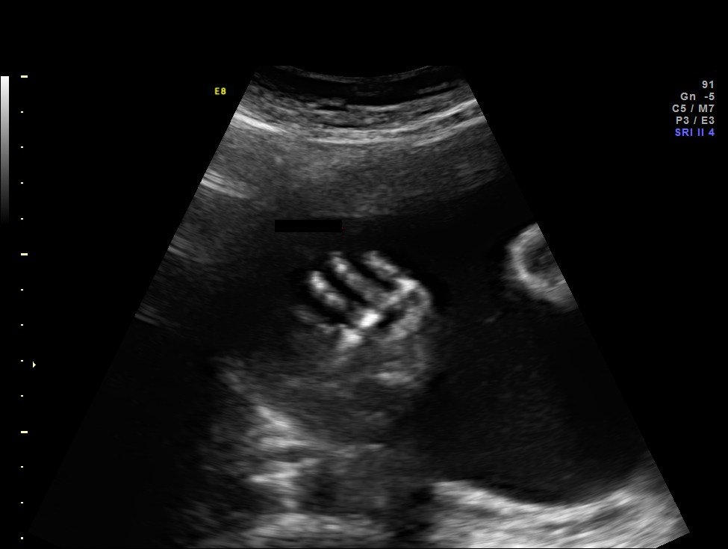

[12 of 28 positions shown; findings below may reference images not displayed]

OBSTETRICS REPORT
                      (Signed Final 08/11/2013 [DATE])

Service(s) Provided

 US OB FOLLOW UP                                       76816.1
Indications

 Suboxone use
 Cigarette smoker
 History of cesarean delivery, currently pregnant      654.20,
Fetal Evaluation

 Num Of Fetuses:    1
 Fetal Heart Rate:  134                          bpm
 Cardiac Activity:  Observed
 Presentation:      Cephalic
 Placenta:          Anterior, above cervical os
 P. Cord            Previously Visualized
 Insertion:

 Amniotic Fluid
 AFI FV:      Subjectively within normal limits
 AFI Sum:     19.09   cm       74  %Tile     Larg Pckt:    8.14  cm
 RUQ:   3.9     cm   RLQ:    3.66   cm    LUQ:   8.14    cm   LLQ:    3.39   cm
Biometry

 BPD:     75.6  mm     G. Age:  30w 2d                CI:         80.5   70 - 86
 OFD:     93.9  mm                                    FL/HC:      19.4   19.6 -

 HC:     270.2  mm     G. Age:  29w 3d       31  %    HC/AC:      1.08   0.99 -

 AC:     249.2  mm     G. Age:  29w 1d       48  %    FL/BPD:     69.4   71 - 87
 FL:      52.5  mm     G. Age:  28w 0d       12  %    FL/AC:      21.1   20 - 24
 HUM:     48.2  mm     G. Age:  28w 2d       30  %

 Est. FW:    4739  gm    2 lb 14 oz      48  %
Gestational Age

 LMP:           29w 6d        Date:  01/14/13                 EDD:   10/21/13
 U/S Today:     29w 2d                                        EDD:   10/25/13
 Best:          29w 0d     Det. By:  Early Ultrasound         EDD:   10/27/13
                                     (03/25/13)
Anatomy

 Cranium:          Appears normal         Aortic Arch:      Previously seen
 Fetal Cavum:      Appears normal         Ductal Arch:      Previously seen
 Ventricles:       Appears normal         Diaphragm:        Previously seen
 Choroid Plexus:   Previously seen        Stomach:          Appears normal, left
                                                            sided
 Cerebellum:       Previously seen        Abdomen:          Appears normal
 Posterior Fossa:  Previously seen        Abdominal Wall:   Previously seen
 Nuchal Fold:      Previously seen        Cord Vessels:     Previously seen
 Face:             Orbits and profile     Kidneys:          Appear normal
                   previously seen
 Lips:             Previously seen        Bladder:          Appears normal
 Heart:            Appears normal         Spine:            Previously seen
                   (4CH, axis, and
                   situs)
 RVOT:             Previously seen        Lower             Previously seen
                                          Extremities:
 LVOT:             Previously seen        Upper             Previously seen
                                          Extremities:

 Other:  Fetus appears to be a male. Heels previously seen. 5th digit
         visualized.
Cervix Uterus Adnexa

 Cervical Length:    3.5      cm

 Cervix:       Normal appearance by transabdominal scan.

 Left Ovary:    Within normal limits.
 Right Ovary:   Within normal limits.
 Adnexa:     No abnormality visualized.
Impression

 Single IUP at 29 0/7 weeks
 Hx of Suboxone use, Tobacco use
 Interval growth is appropriate (48th %tile)
 Normal amniotic fluid volume
Recommendations

 Follow-up ultrasounds as clinically indicated.
